# Patient Record
Sex: Female | Born: 1945 | Race: White | Hispanic: No | Marital: Single
Health system: Midwestern US, Academic
[De-identification: ages and names within clinical notes are randomized; demographics above are authoritative.]

---

## 2014-10-17 IMAGING — MG MAMMOGRAM, DIGITAL SCREEN BILA
5 series · 8 of 8 positions shown · non-contrast
Comparison: [DATE] [DATE], [DATE], [DATE] [DATE], [DATE].

MAMMOGRAM DIGITAL SCREENING BILATERAL
TECHNIQUE: Four standard projections plus supplemental left XCCL projection with CAD.

[R CC]
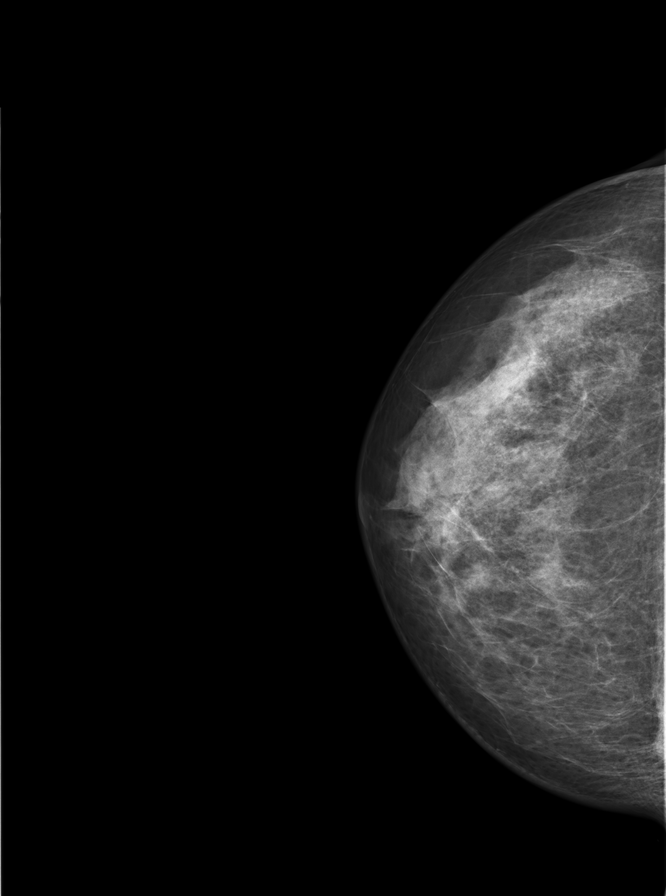

[L CC]
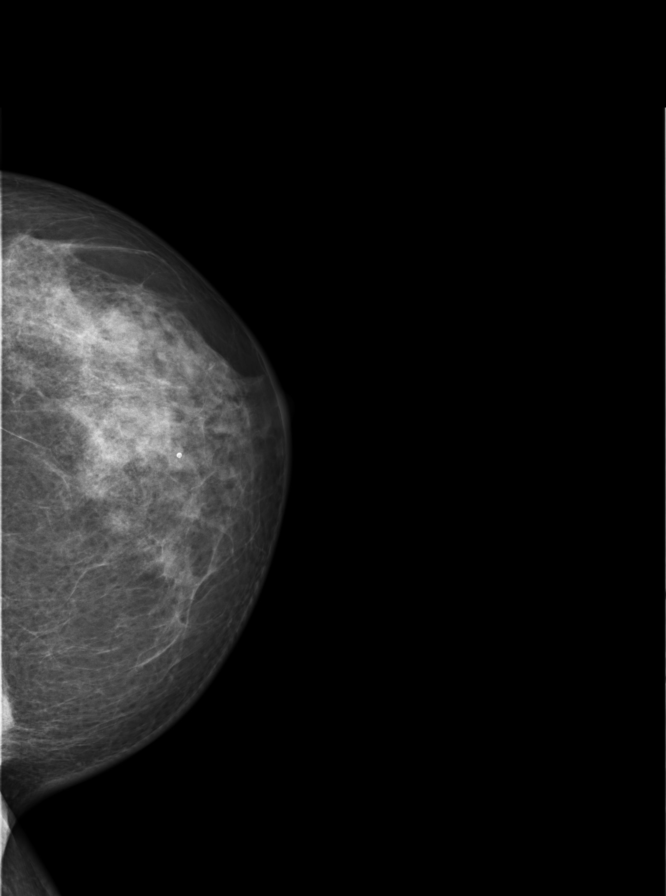

[Series 3: R MLO · right · 2 of 2 slices shown]
[im 1/2]
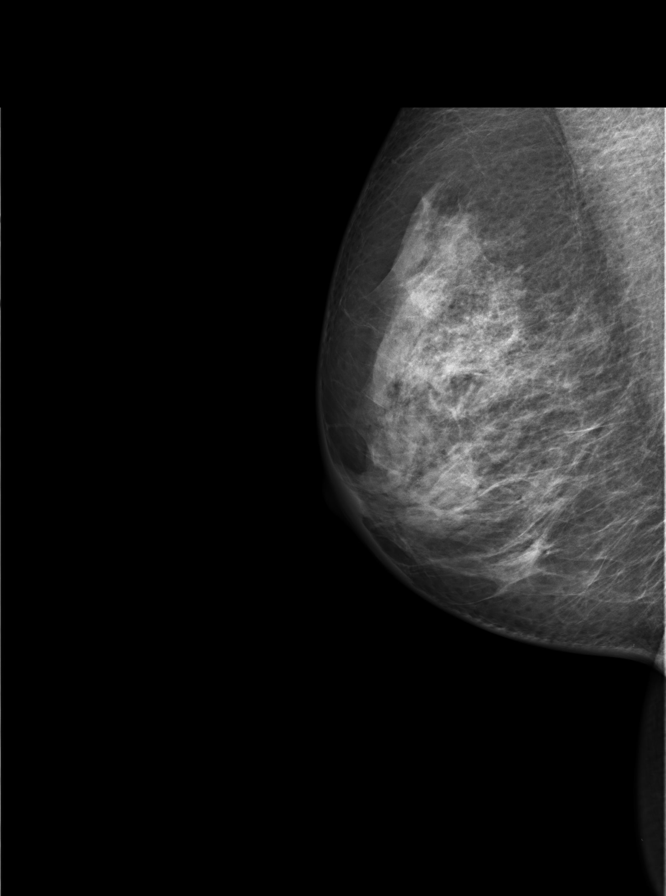
[im 2/2]
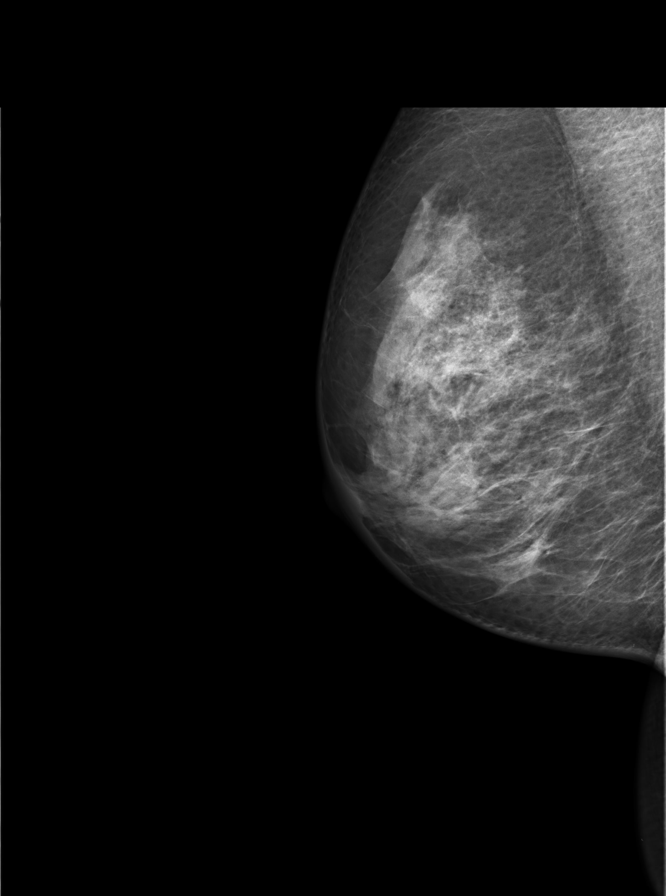

[Series 4: L MLO · left · 2 of 2 slices shown]
[im 1/2]
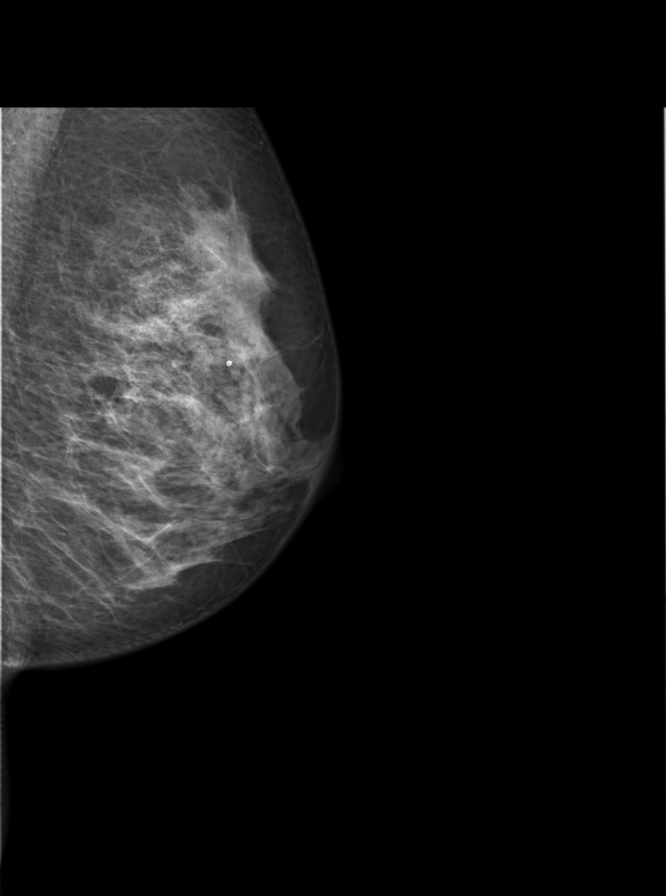
[im 2/2]
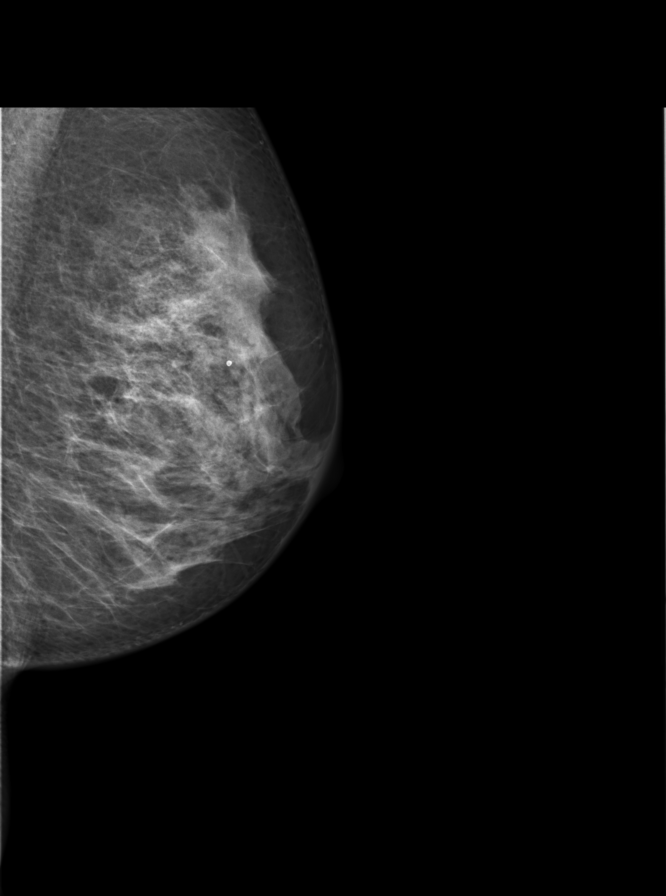

[Series 5: L XCC · left · 2 of 2 slices shown]
[im 1/2]
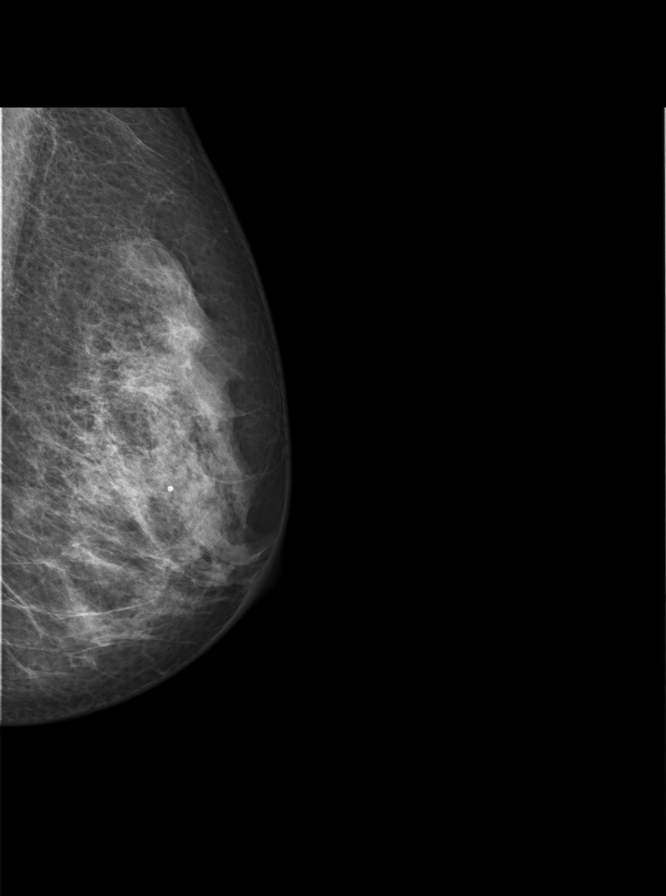
[im 2/2]
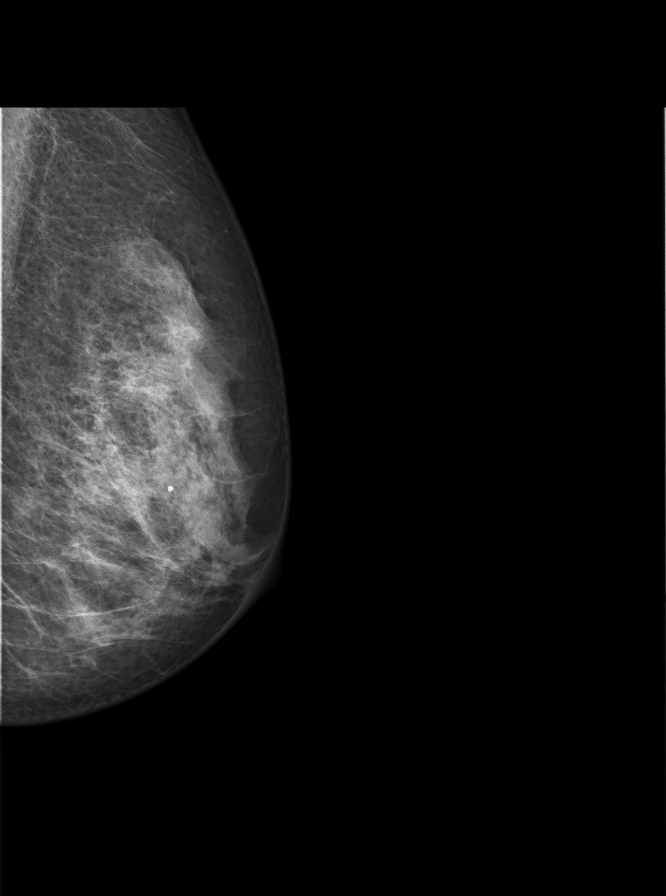

[8 of 8 positions shown; findings below may reference images not displayed]

IMPRESSION: BIRADS 1:  Negative mammogram.
Annual mammographic follow up is recommended.
The absence of a significant mammographic finding should not delay biopsy of
any clinically suspicious lesion.

INDICATIONS:
Screening.
Nulliparity.
FINDINGS: There is a stable scattered fibroglandular pattern.
There is no new, dominant, nor suspicious parenchymal asymmetry,
architectural distortion or calcification.

X-ray dose in mGy
Right CC
Right MLO
Left CCA
Left MLO
Left XCCL

BI-RADS:

FOLLOWUP:

Tech Notes: NULLIPARITY.  SCREENING.  AB
([id number])

## 2015-08-05 IMAGING — CR PELVIS
3 series · 3 of 3 positions shown · non-contrast
Comparison: Lumbar spine

EXAM: Right hip
INDICATION: Right hip pain
TECHNIQUE: AP and frog-leg projections.

[pelvis]
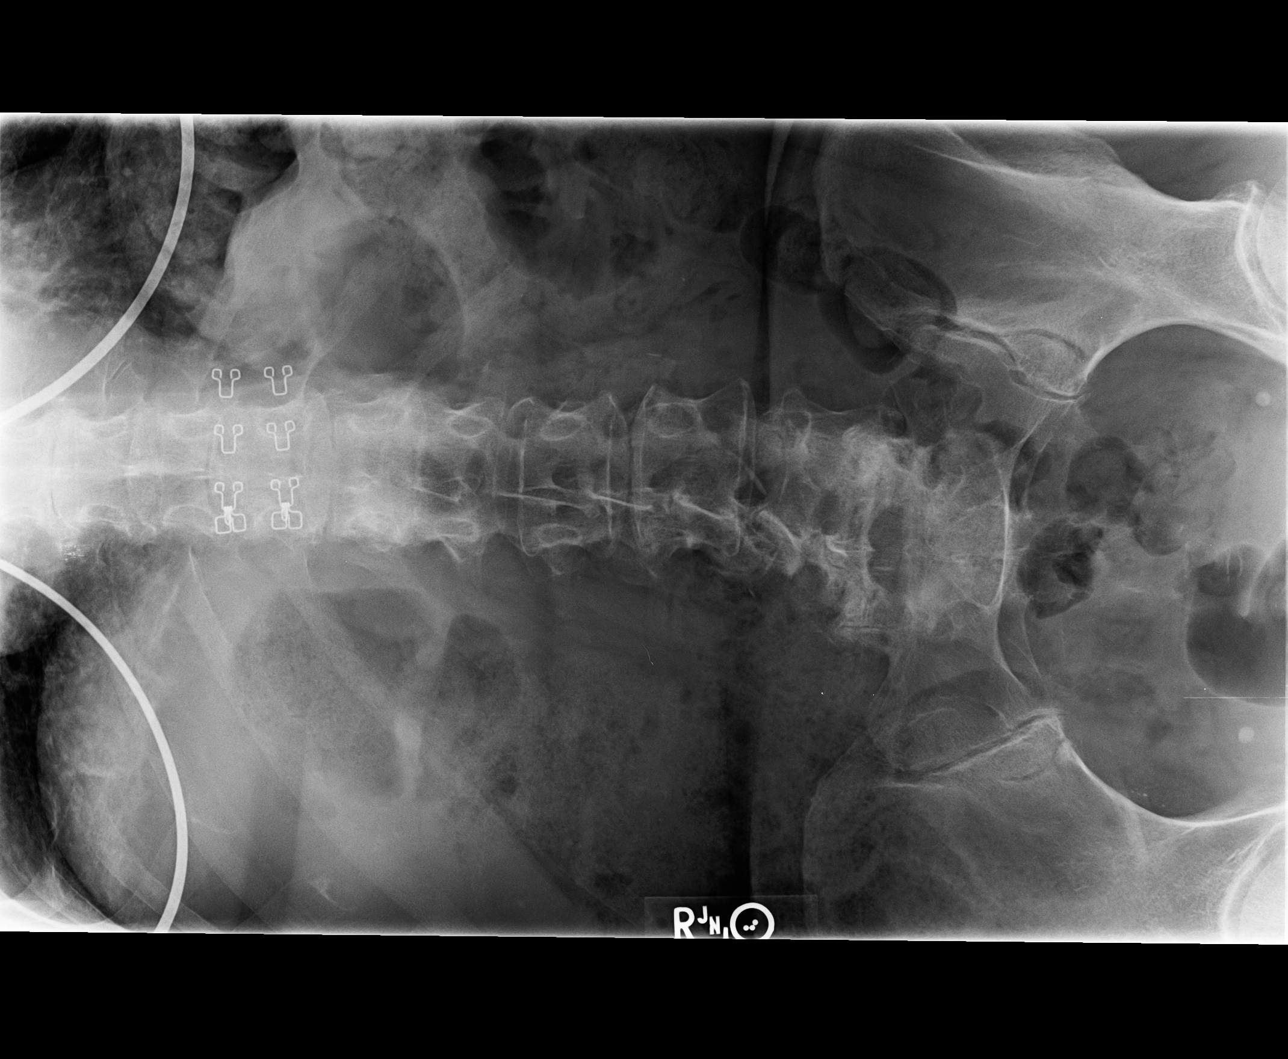

[hip ap]
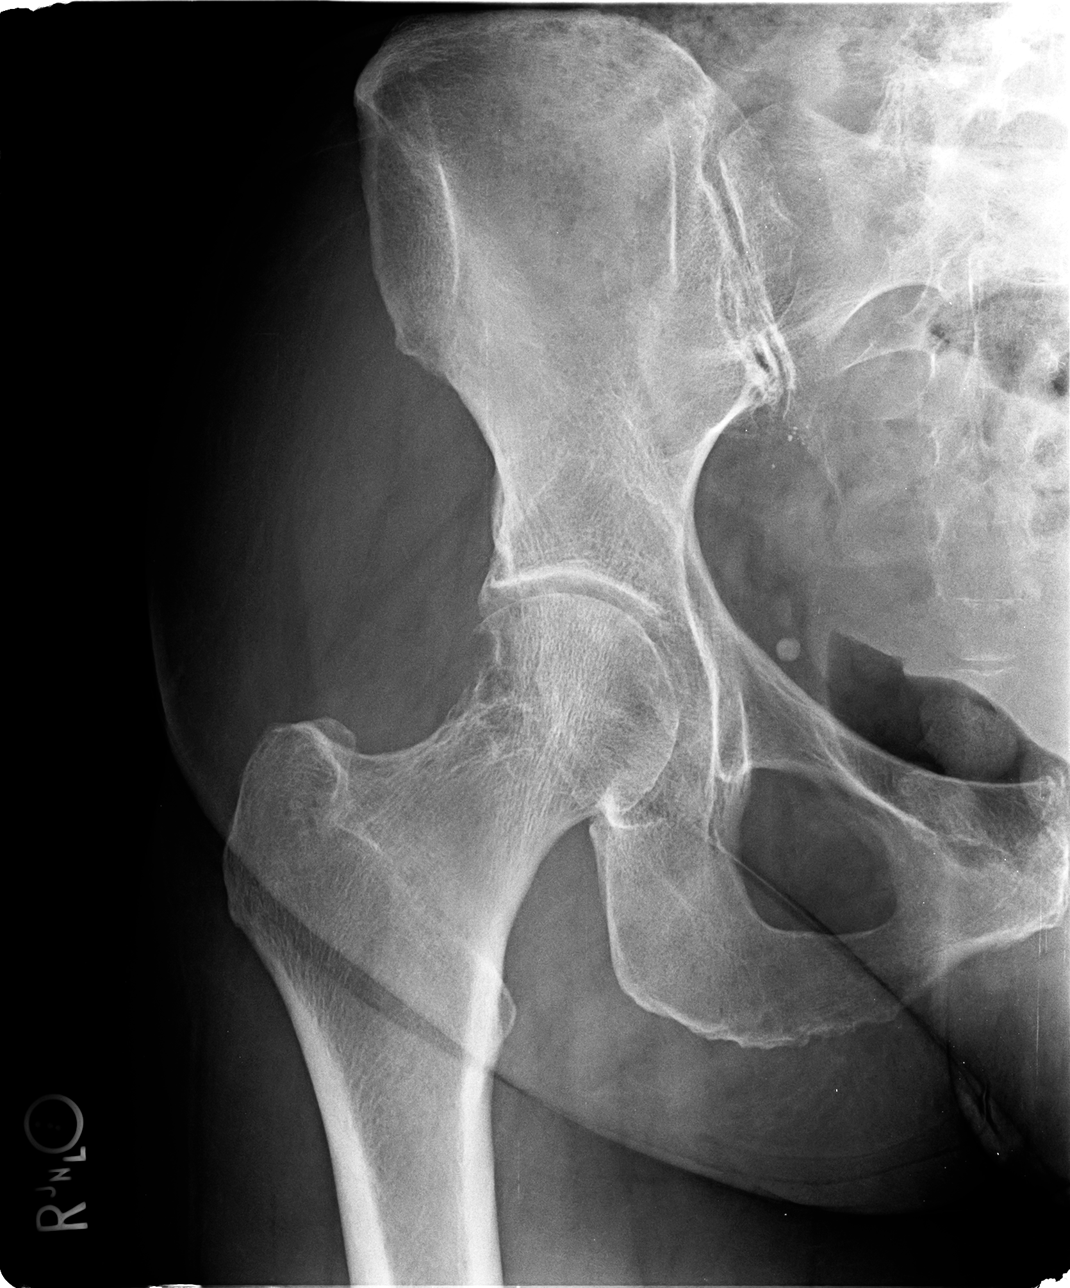

[hip frog]
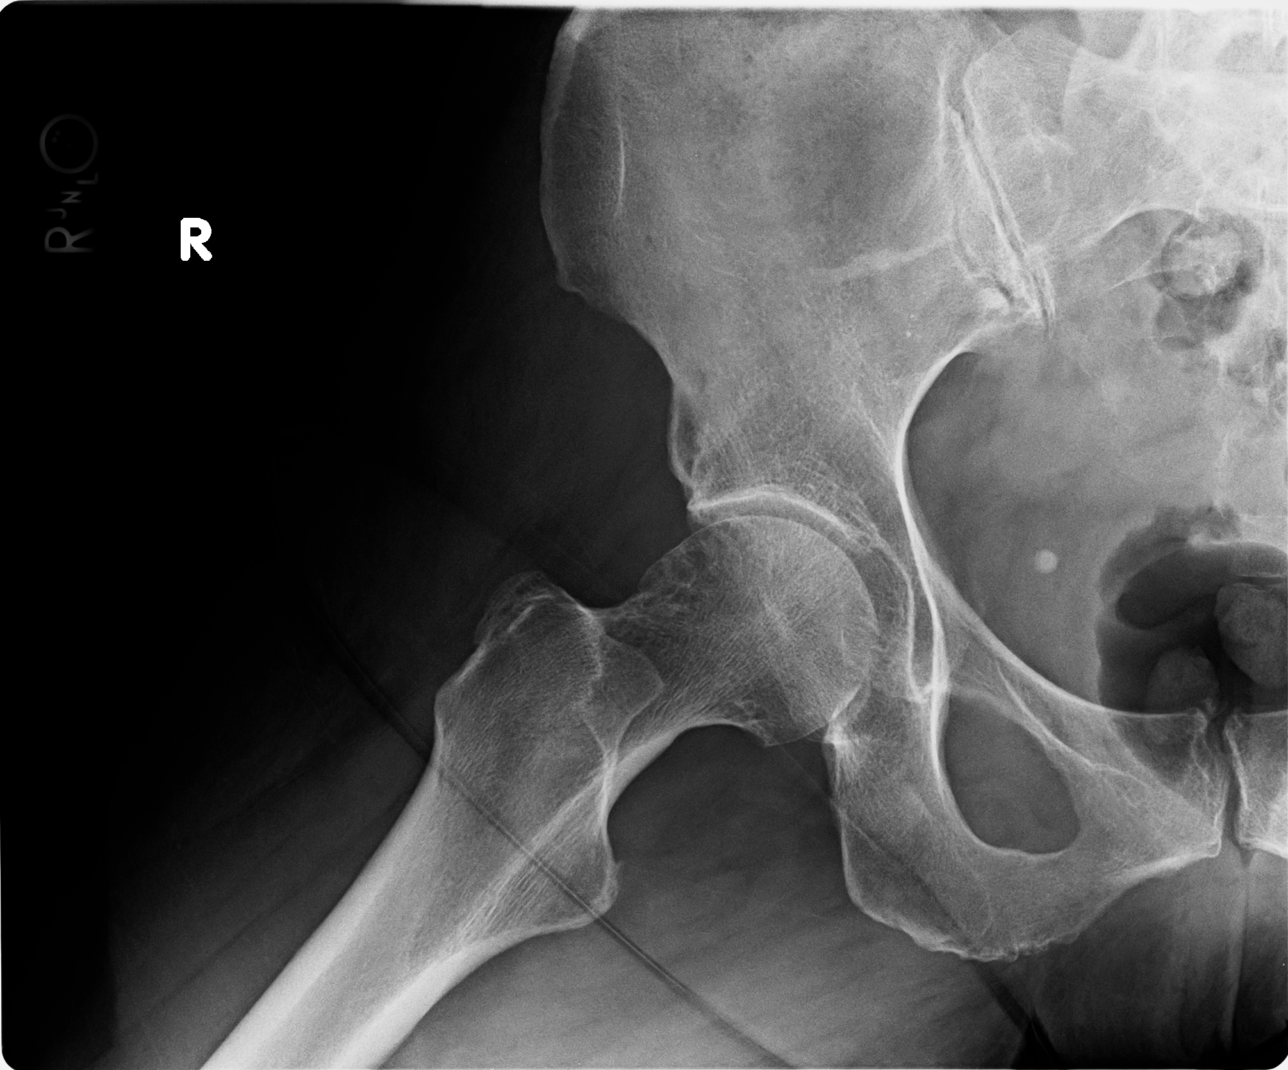

[3 of 3 positions shown; findings below may reference images not displayed]

IMPRESSION: Multiple 5-7 mm cysts of the lateral aspect of the right femoral head and neck
extending approximately 50% in thickness of the hip.
No acute bony process.
MRI of the hip should be considered
FINDINGS: There is no acute fracture or dislocation.
The joint space is preserved.
There is no significant arthrosis of the hip joint.
There are multiple 5-7 mm lucent area along the lateral aspect of the right
femoral head and neck compatible with cysts.
These extend through approximate 50% of the thickness of the hip.
There there is mild sclerosis of the muscular insertions of the ischial
tuberosity

## 2015-08-08 IMAGING — CR BMD
1 series · 1 of 1 positions shown · non-contrast
Comparison: None.

EXAM:  BONE DENSITOMETRY
INDICATION: Osteoporosis.
Takes calcium daily.
Approximate one inch in height loss.
Calcium deficiency on blood work.
Natural menopause at age 50.
Took Premarin for two years.
History of wrist fracture in 2404.
TECHNIQUE: Dual energy x-ray photometry of the lumbar spine and the left hip.

[contrast]
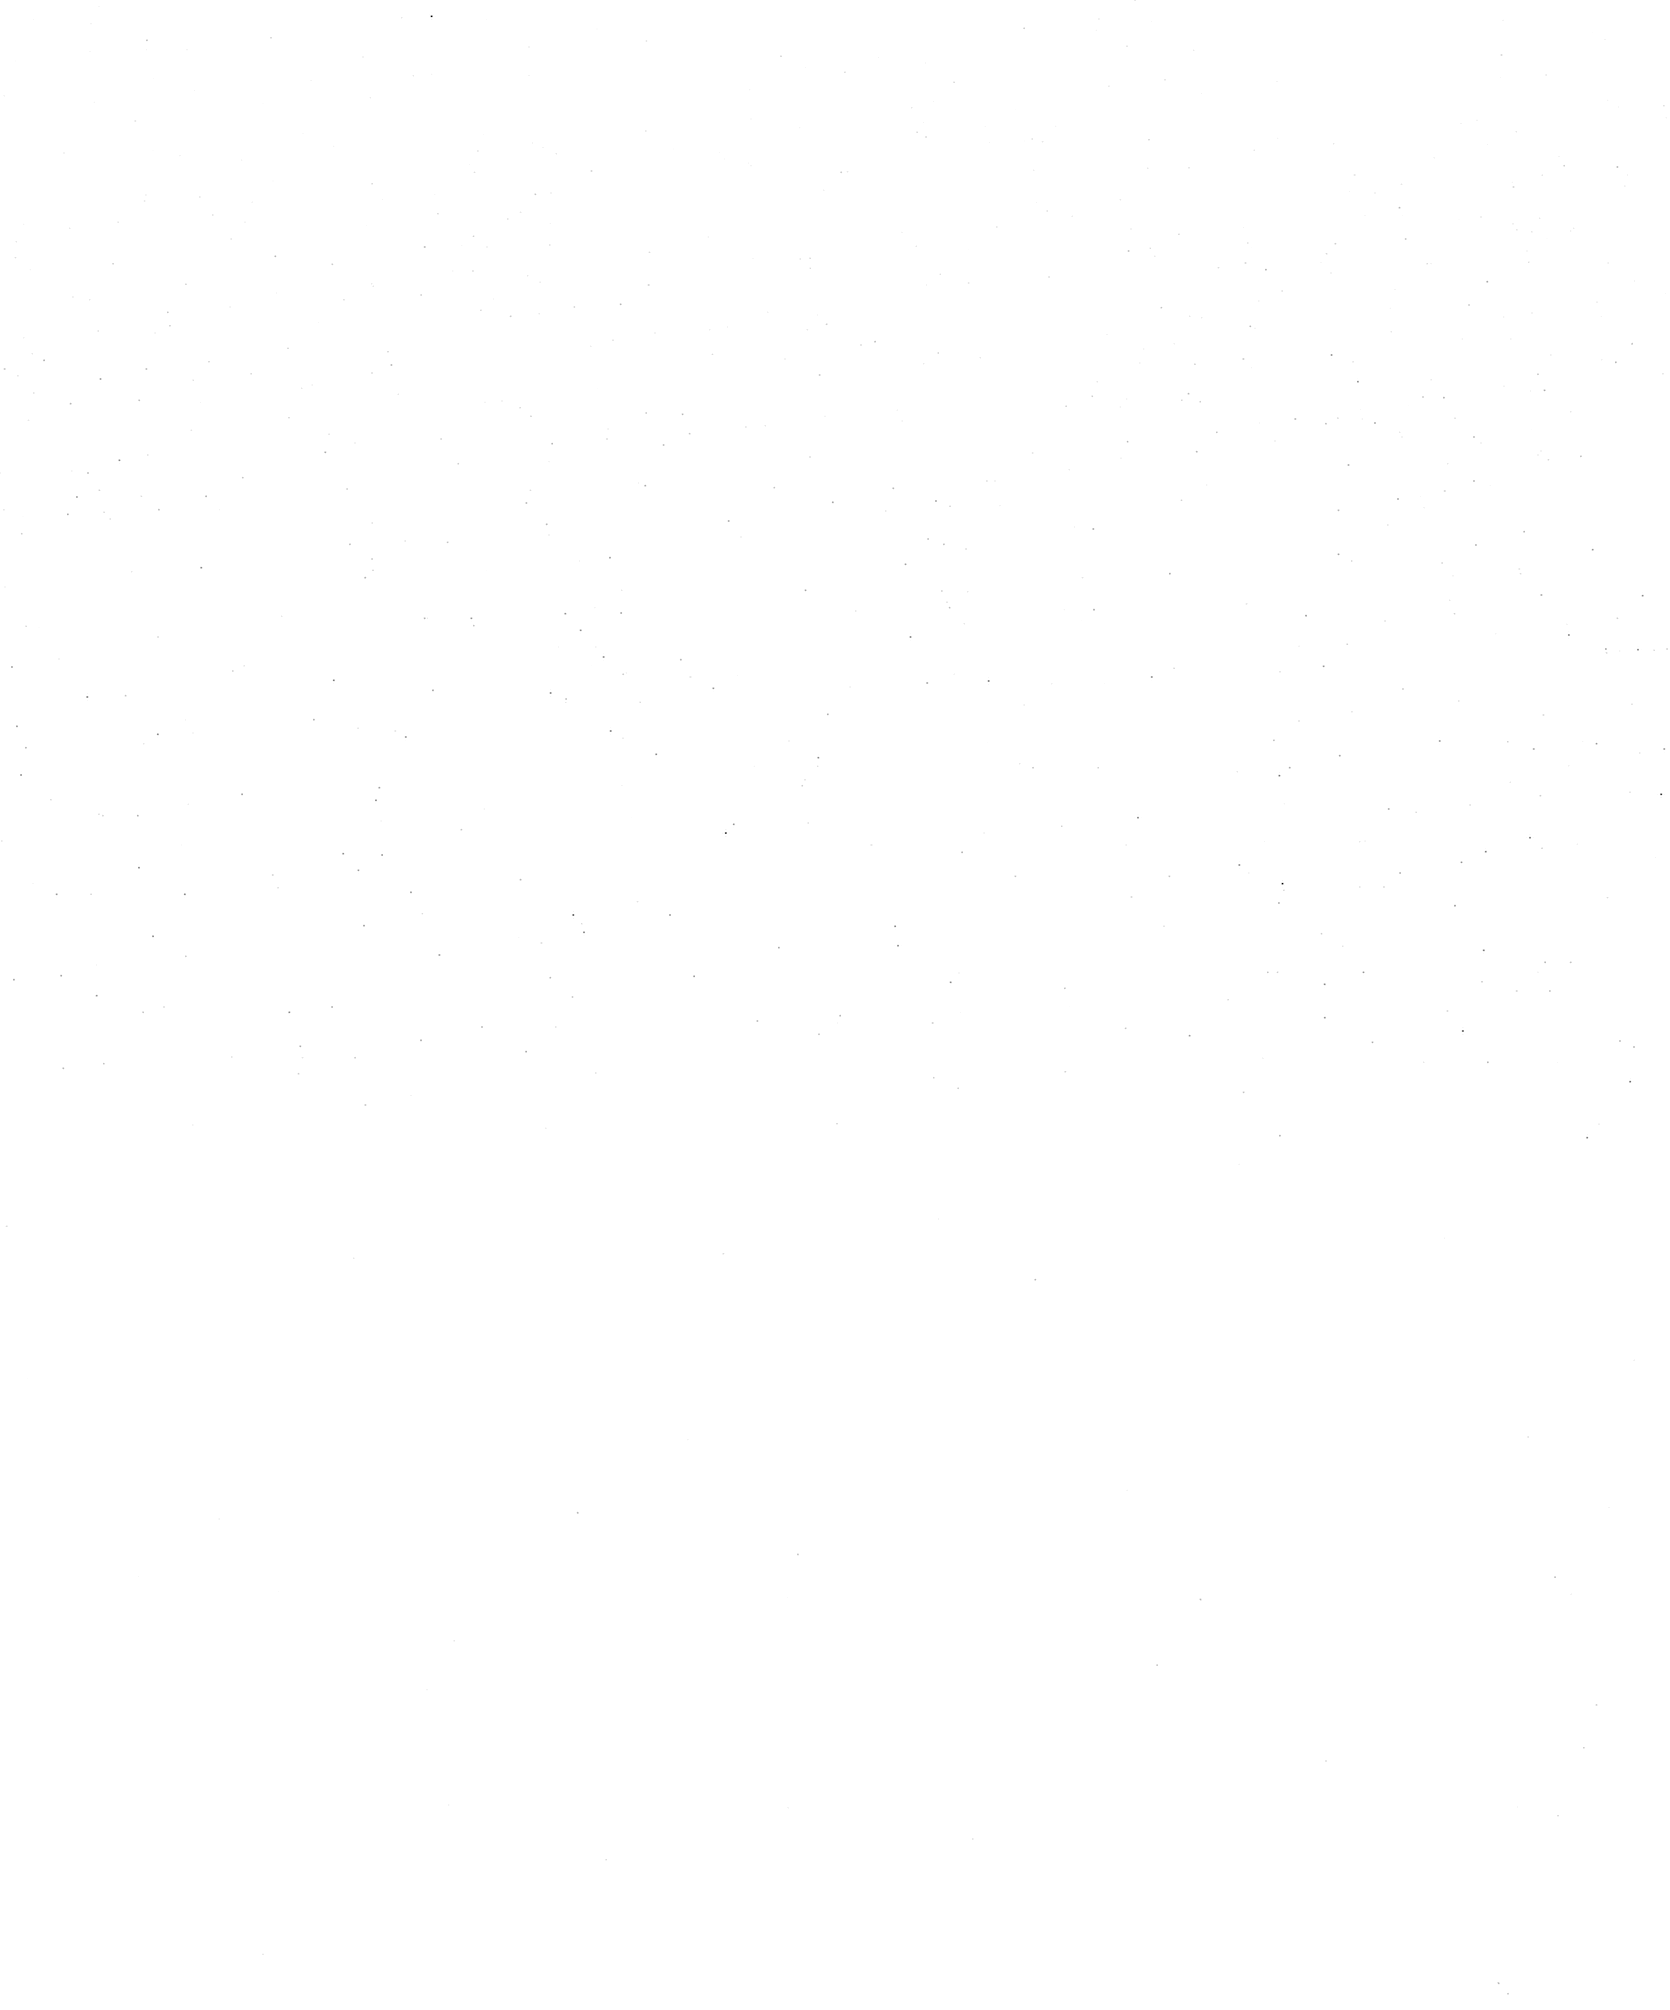

[1 of 1 positions shown; findings below may reference images not displayed]

FINDINGS: There is a mild levoscoliosis of the lumbar spine with mild-to-moderate spondylosis.
Bone mineral density 1.045 g/cm2; T-score -1.3, Z-score 0.6.
Left hip:  Bone mineral density is 0.82 g/cm2; T-score -1.5, Z-score -0.3.
IMPRESSION: Lumbar spine and left hip, osteopenia, fracture risk increased.

Tech Notes: APPROX 1 INCH HEIGHT LOSS. CALCIUM DIFFICIENCY ON BLOOD WORK. NATURAL MENOPAUSE AT AGE
50-TOOK PREMARIN FOR 2 YEARS. TAKES CALCIUM DAILY-HELD TODAY. H/O WRIST FX IN 2404. ME

## 2015-08-13 IMAGING — MR Hips^ROUTINE
4 of 6 series · 29 of 48 positions shown · non-contrast
Comparison: none

[Series 2: T1 · coronal · 4.0mm · 0.74mm/px · 7 of 19 slices shown]
[im 1/19]
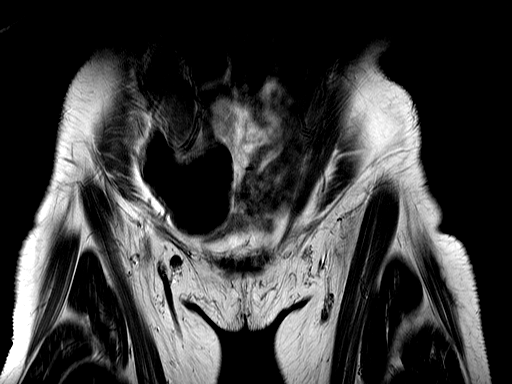
[im 4/19]
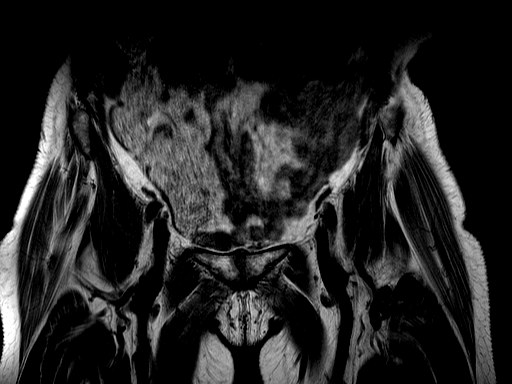
[im 7/19]
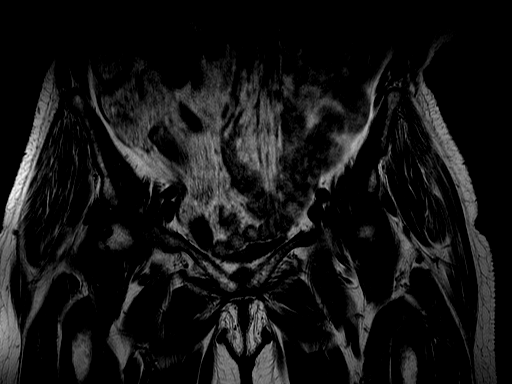
[im 10/19]
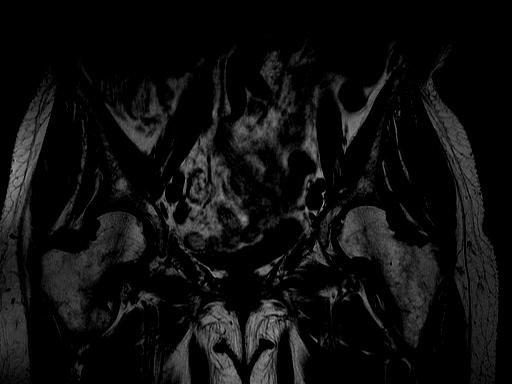
[im 13/19]
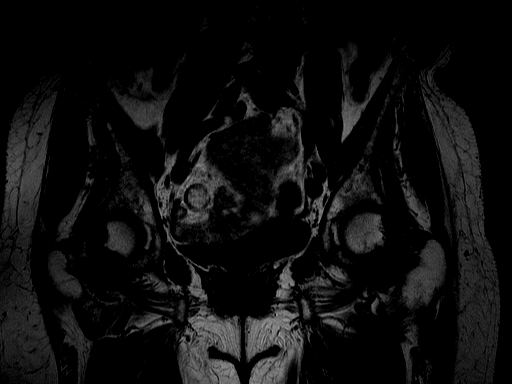
[im 16/19]
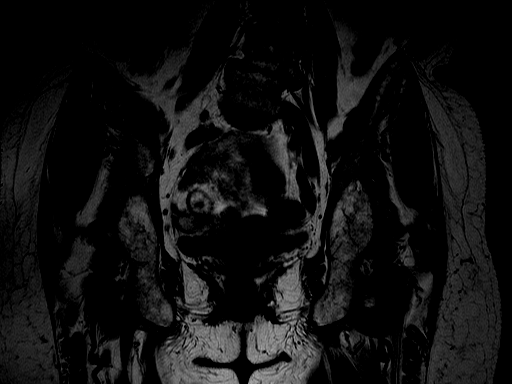
[im 19/19]
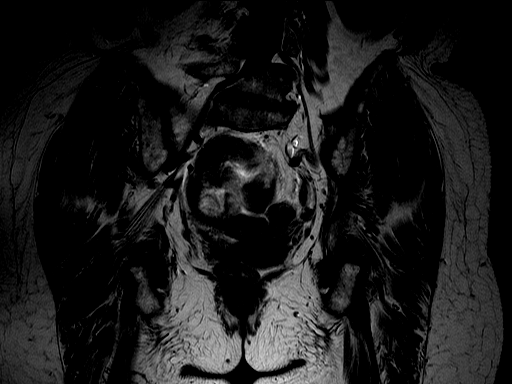

[Series 4: T2 fat-sat · axial · 5.0mm · 0.68mm/px · z∈[-89,+79]mm · 9 of 28 slices shown (1 of 2)]
[im 1/28]
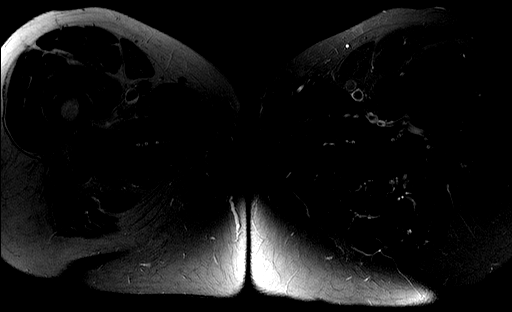
[im 4/28]
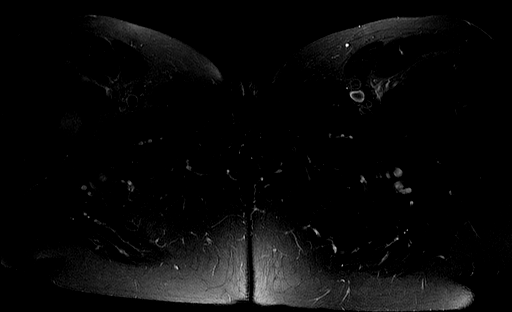
[im 7/28]
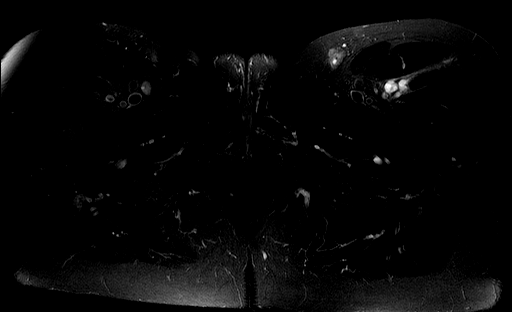
[im 11/28]
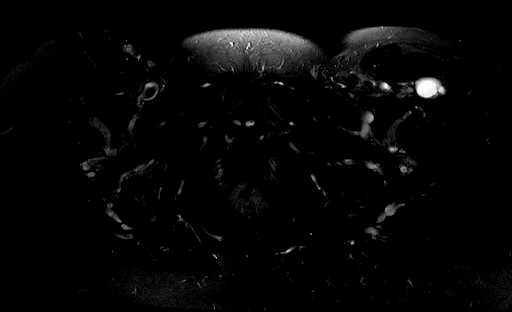
[im 14/28]
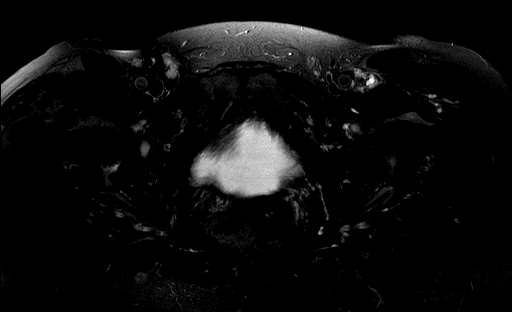
[im 17/28]
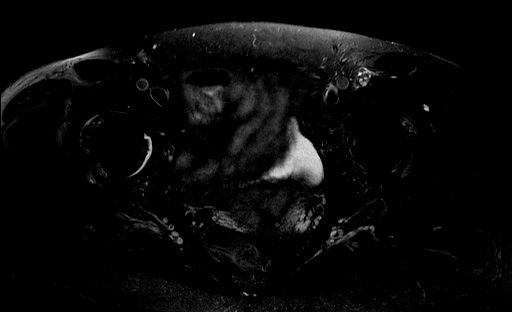
[im 21/28]
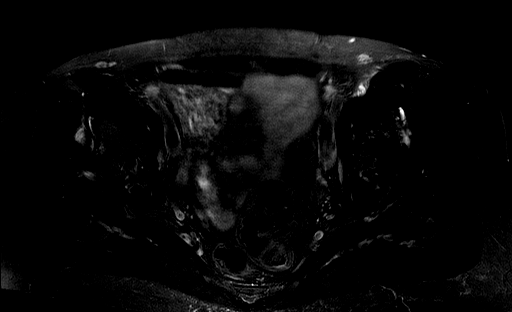
[im 24/28]
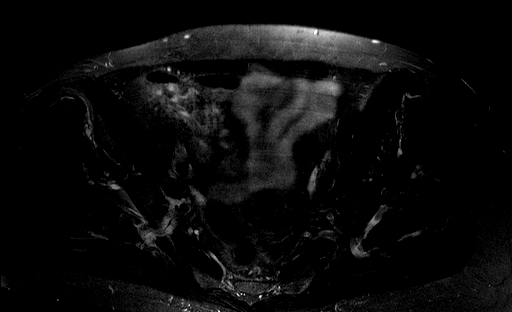
[im 28/28]
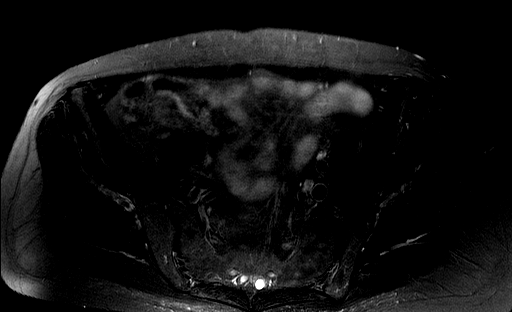

[Series 5: T1 fat-sat · axial · 5.0mm · 0.74mm/px · z∈[-90,+53]mm · 6 of 28 slices shown]
[im 1/28]
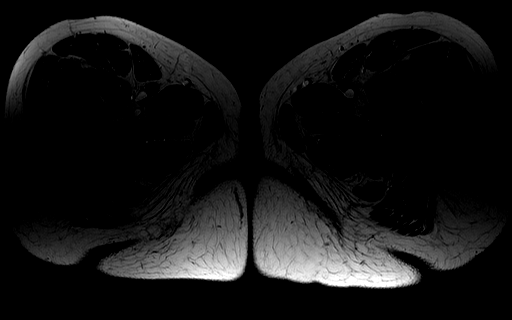
[im 4/28]
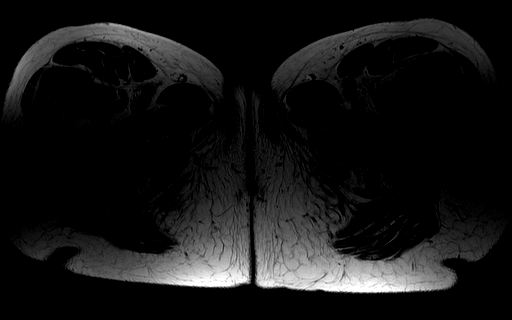
[im 7/28]
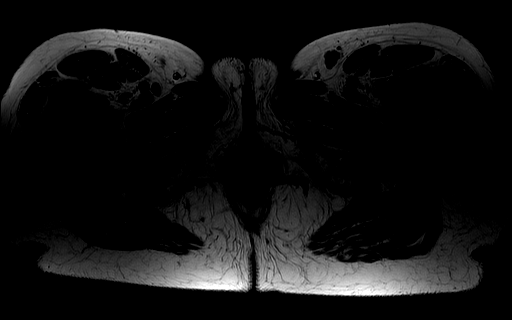
[im 11/28]
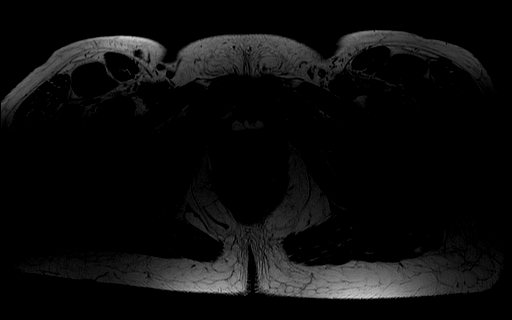
[im 14/28]
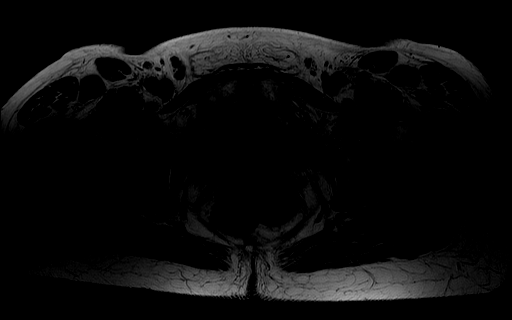
[im 24/28]
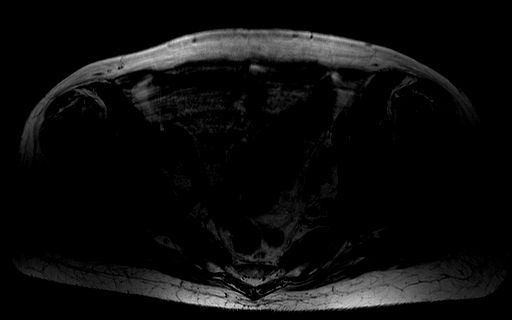

[Series 7: T2 fat-sat · sagittal · 3.5mm · 0.57mm/px · 7 of 20 slices shown (2 of 2)]
[im 1/20]
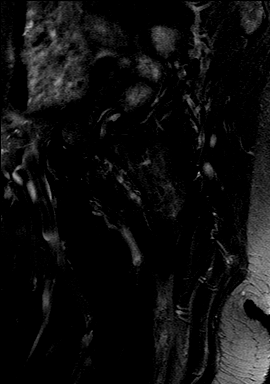
[im 4/20]
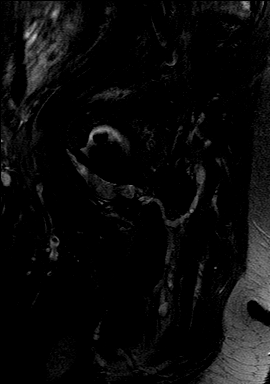
[im 7/20]
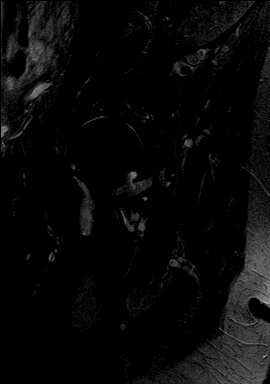
[im 10/20]
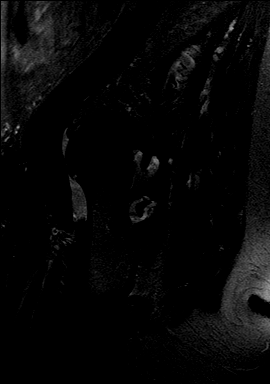
[im 13/20]
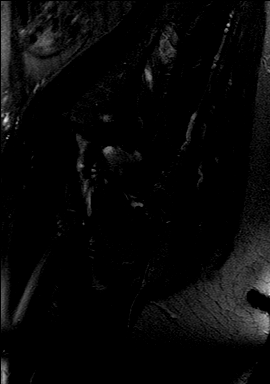
[im 16/20]
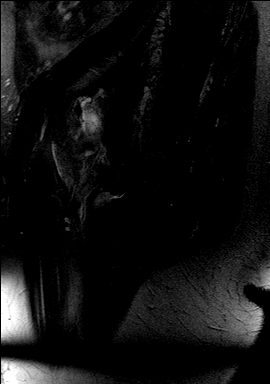
[im 20/20]
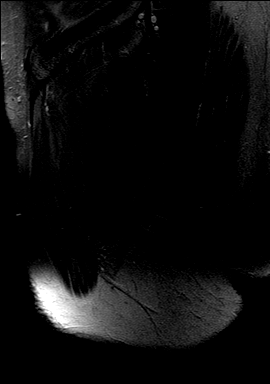

[29 of 48 positions shown; findings below may reference images not displayed]

MRI REPORT

DIAGNOSTIC STUDIES

EXAM
Right hip MRI.

INDICATION
right hip pain, cysts seen on xray
Right posterior hip/buttocks pain x1 year, started after putting luggage
into a car. Abnormal play film xrays.
BG

TECHNIQUE
Noncontrast brain MRI protocol. All pelvis T1, STIR, T2 fat sat sequencing with sagittal T2 fat
saturated sequence of the right hip.

COMPARISON
No prior studies are available for comparison.

FINDINGS
Marrow/joint space: There is mild bilateral hip degeneration with marginal osteophytic spurring
and mild subchondral cystic change, particularly at the right lateral acetabulum. There is also
cystic change at the marginal femoral head neck junction bilaterally. There is a right hip joint
effusion. Loss of femoral head neck junction is suggested bilaterally with synovial herniation pits
appreciated, particularly on the right. There is also small amount of left hip joint fluid. Prior
hip radiographs are not available for review at this time. There is no evidence of periostitis or
aggressive osseous lesion. No fracture. Lumbar spondylosis is moderate to severe with grade 1 left
lateral listhesis of L4 on L5.
Soft tissues: There is mild edema around both hips, right greater than left. This extends into the
right gluteus minimus muscle. There is mild edema around the greater trochanter bilaterally.
Partial thickness tearing of the right gluteus minimus insertion, greater than 50 percent
thickness. There is a small cluster of cysts in the left hip anterior to the vastus medialis and
posterior to the rectus femoris muscle which measures up to approximately the 4.2 x 1.3 x 8 the
centimeters in dimension. Small lymph nodes are appreciated in the inguinal region bilaterally. The
urinary bladder is largely decompressed. There is no disruption of the hamstring tendons. The hip
flexors show no disruption, though there is fluid around the left iliopsoas bursal space suggesting
bursitis. The short adductor of the hip showed no edema or atrophy.

IMPRESSION
- Bilateral hip degeneration with bilateral hip joint effusions, right greater than left.
Degenerate change of the right hip is also asymmetrically greater. There are some the erosive
changes appreciated at the posterolateral femoral head bilaterally with osteophytic spurring and
subchondral cystic changes also noted. However, there is no significant bone edema. No fracture.
Findings are nonspecific and are likely a combination osteoarthropathy with possible inflammatory
arthritis also noted. Crystalline arthropathy is not excluded. There is also a multiloculated cyst
like lesion in the left anterior hip musculature. Left iliopsoas bursitis is noted.

## 2015-10-24 IMAGING — US US DUPLEX RENAL ARTERY
1 series · 13 of 25 positions shown · non-contrast
Comparison: none

[Series 1: us duplex renal artery · 13 of 51 slices shown]
[im 1/51]
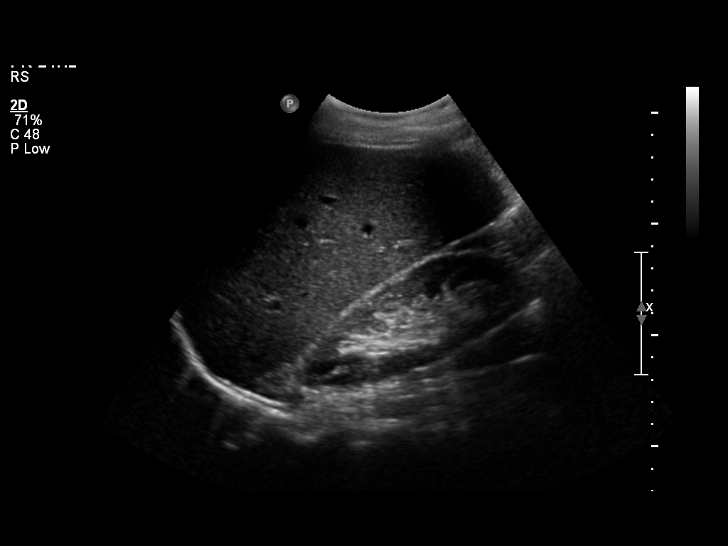
[im 5/51]
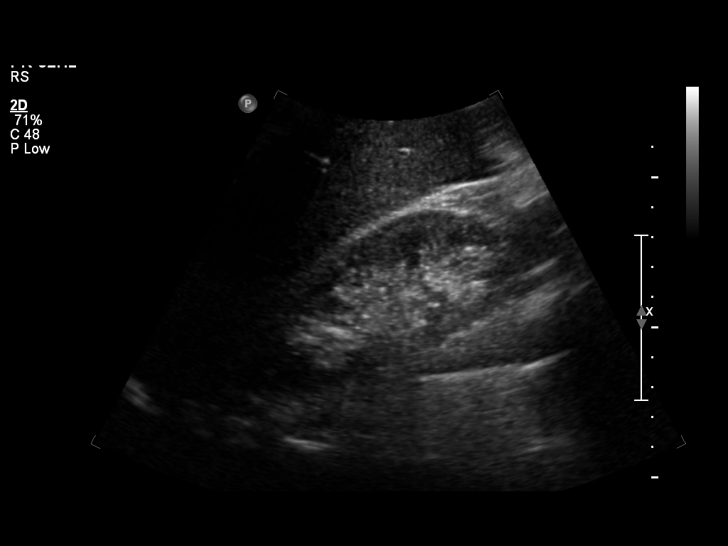
[im 9/51]
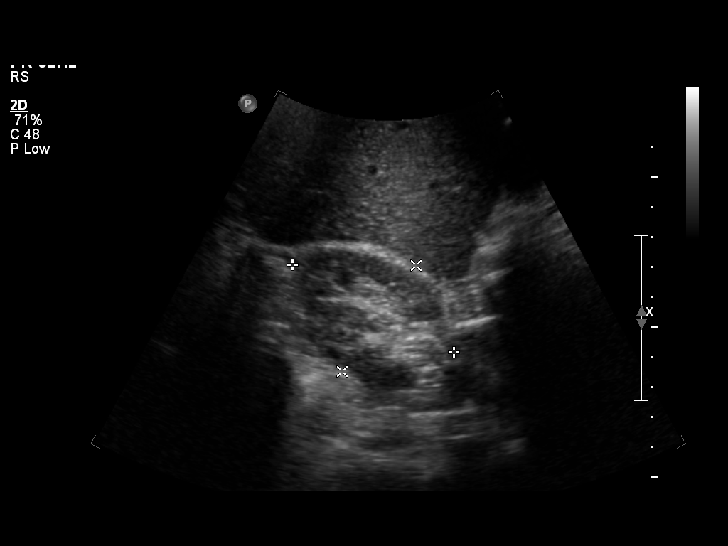
[im 13/51]
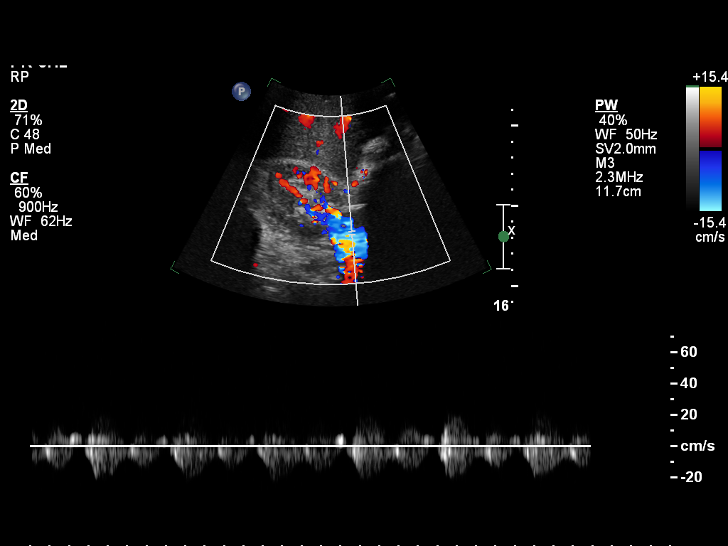
[im 17/51]
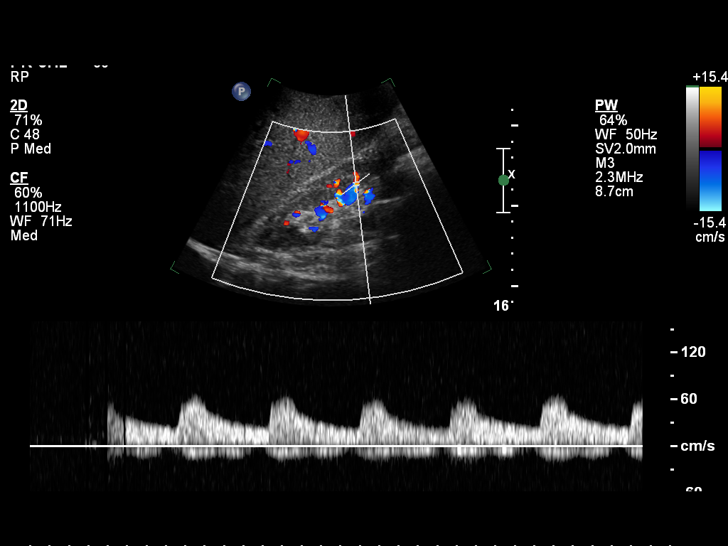
[im 21/51]
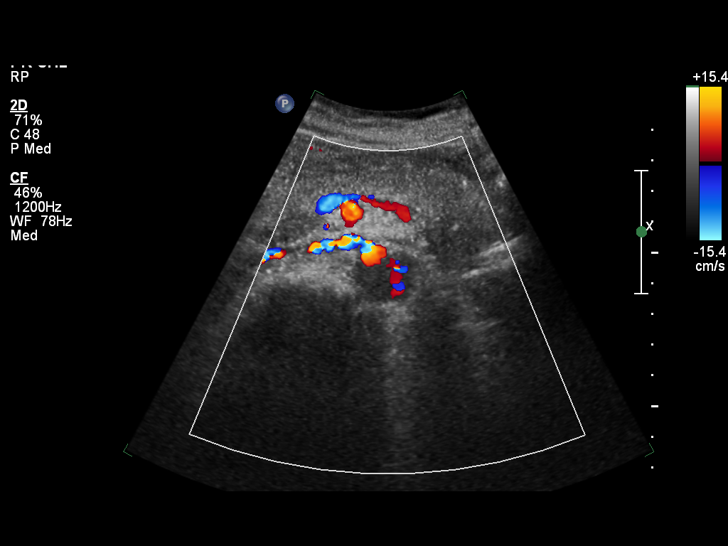
[im 26/51]
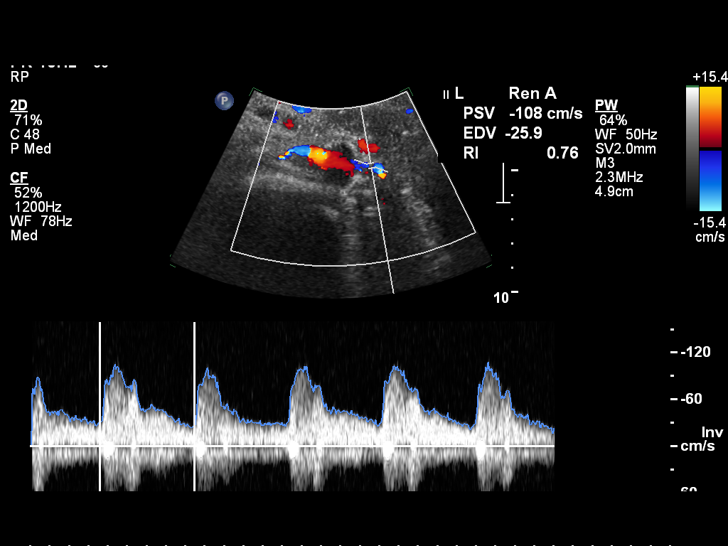
[im 30/51]
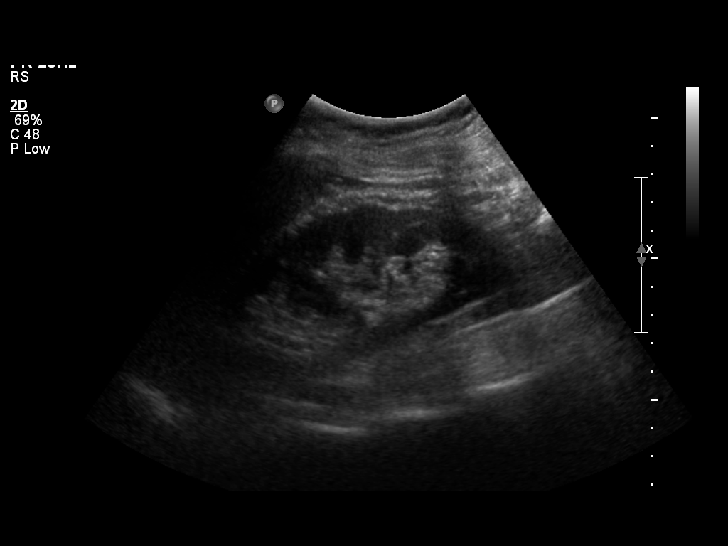
[im 34/51]
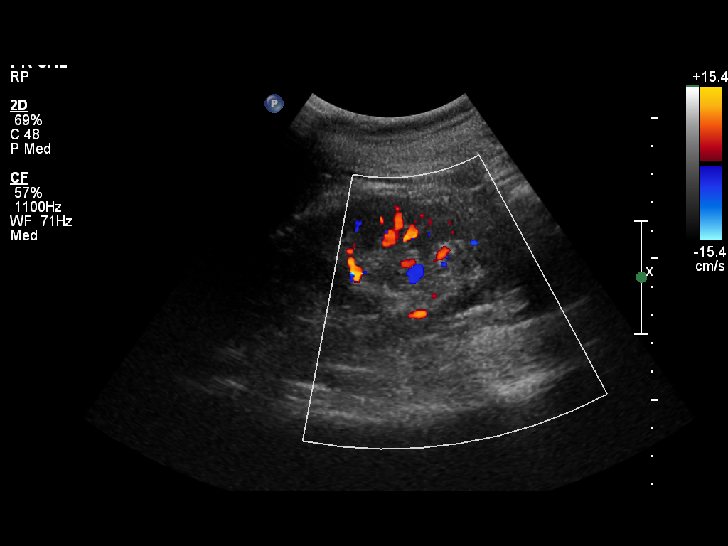
[im 38/51]
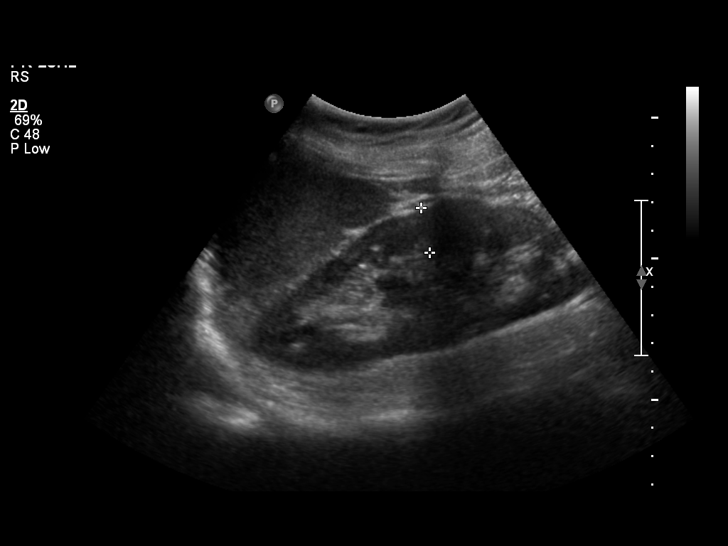
[im 42/51]
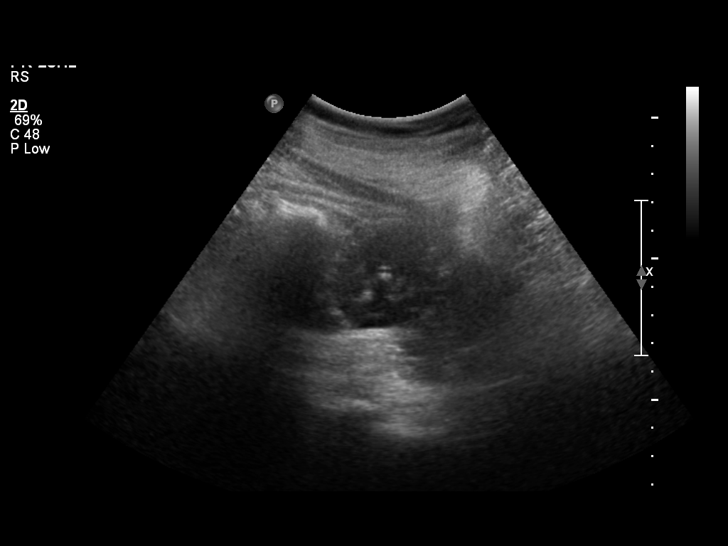
[im 46/51]
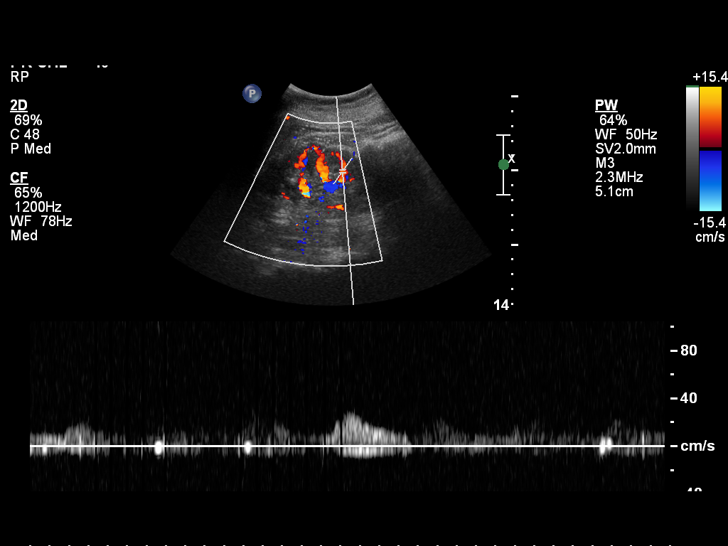
[im 51/51]
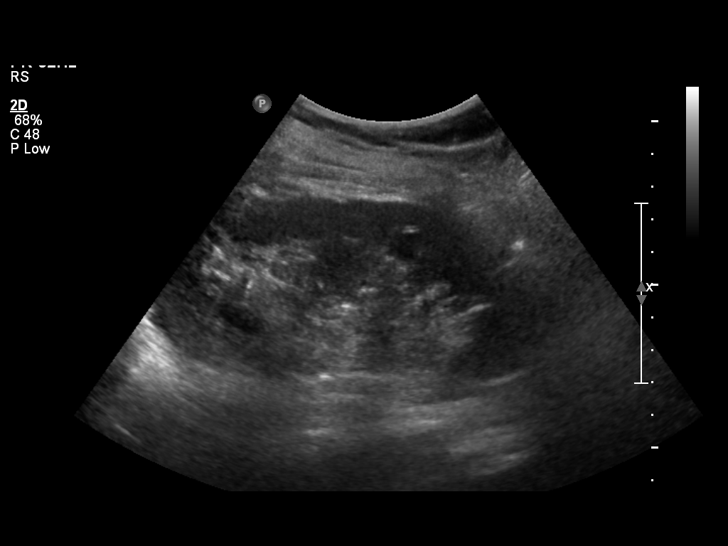

[13 of 25 positions shown; findings below may reference images not displayed]

ULTRASOUND REPORT

DIAGNOSTIC STUDIES

EXAM
Renal artery color duplex Doppler ultrasound

INDICATION
CHEST BURNING, HTN
JERRID

TECHNIQUE
Grayscale and color duplex Doppler imaging of the bilateral kidneys and accompanying vasculature
was performed with spectral analysis.

COMPARISON
None

FINDINGS
Right kidney measures 11.7 x 6.1 x 4.3 centimeter and left kidney 12.7 x 5.1 x 5.4 centimeter.
Normal thickness renal cortices. No hydronephrosis or shadowing nephrolithiasis. Normal parenchymal
echogenicity.
Right-sided renal aortic ratios and peak systolic velocities include origin RAR 1.44 and 160
centimeter/second, mid RAR 1.29 and 144 centimeter/second, and distal RAR 1.25 and 139 centimeter/
second. The right renal vein is patent. There are no parvus tardus waveforms.
Peak systolic velocity of the abdominal aorta is 111 centimeter/second.
] Left-sided renal aortic ratios and peak systolic velocities include origin RAR 1.28 and 142
centimeter/second, mid RAR 1.20 and 134 centimeter/second, and distal RAR 1.17 and 130 centimeter/
second. The left renal vein is patent. There are no parvus tardus waveforms.

IMPRESSION
No hemodynamically significant stenosis.

## 2016-05-27 IMAGING — CR NECK
6 series · 6 of 6 positions shown · non-contrast
Comparison: none

[c-spine lat]
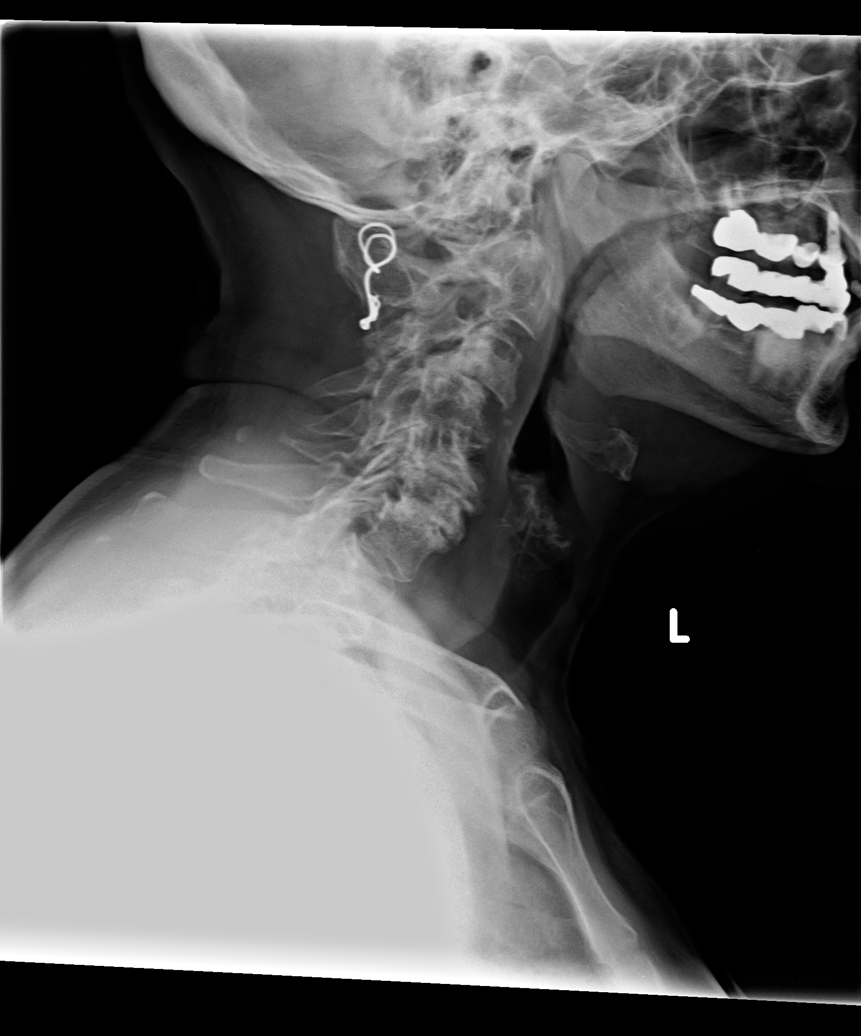

[c-spine obl (1 of 2)]
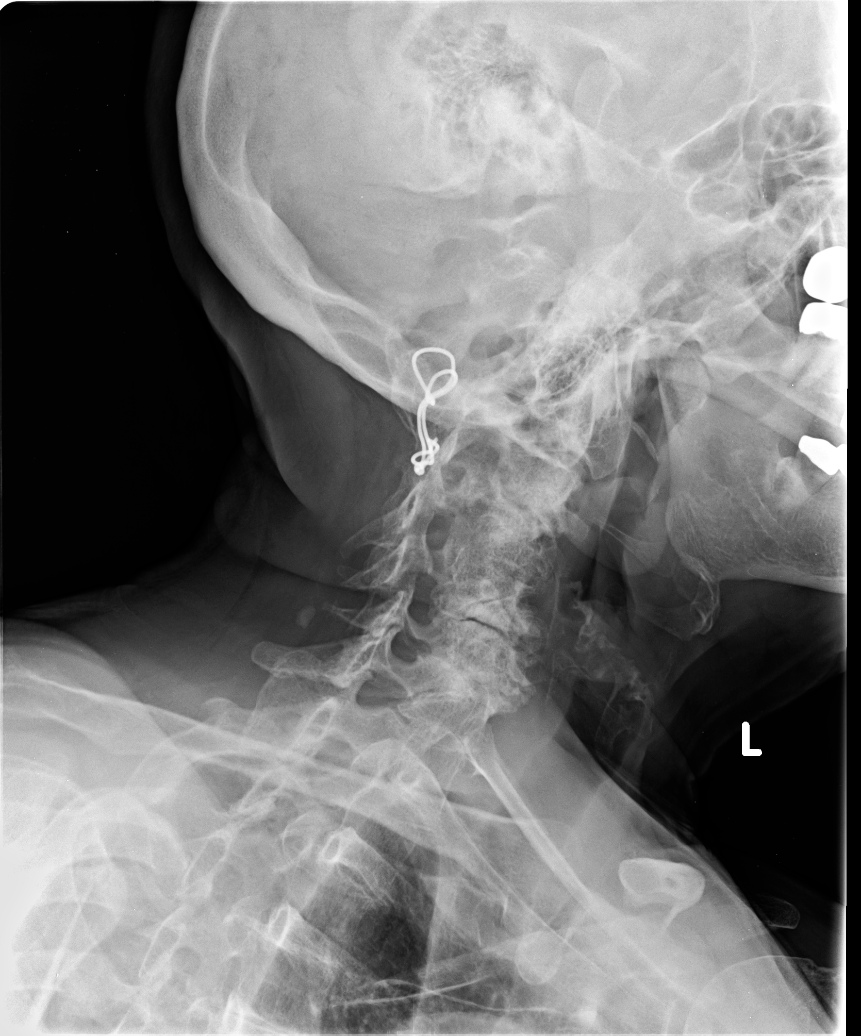

[c-spine obl (2 of 2)]
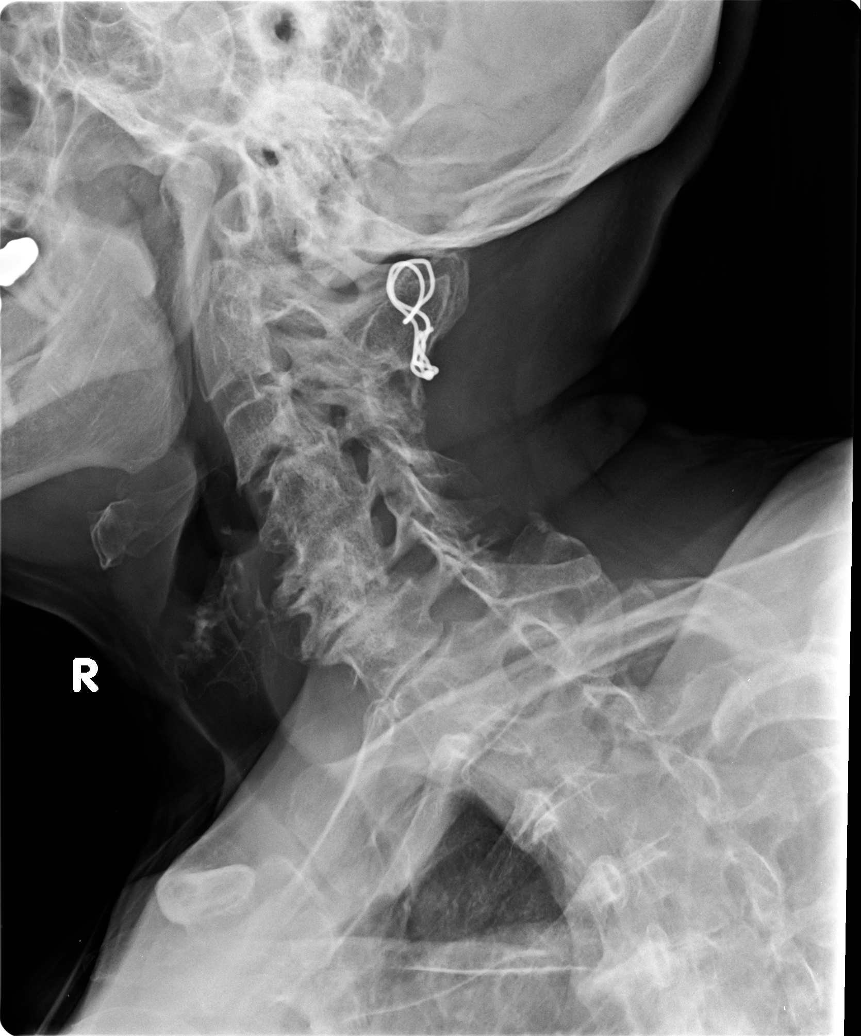

[c-spine ap]
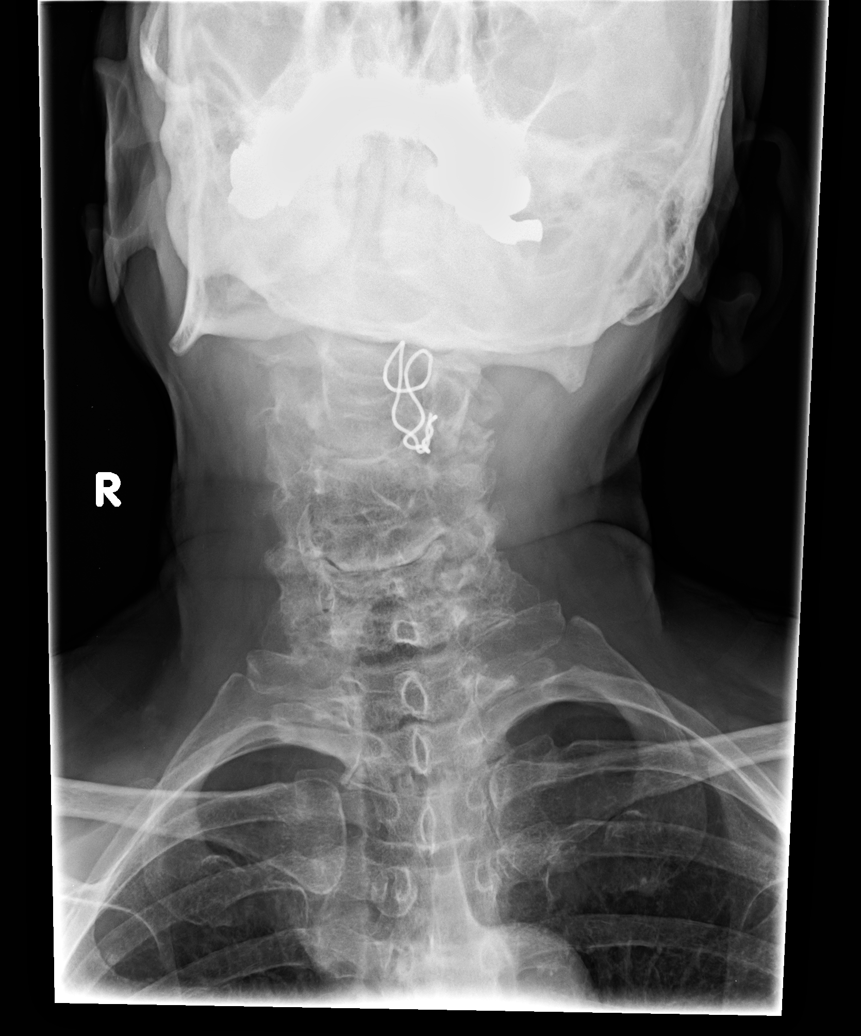

[odontoid]
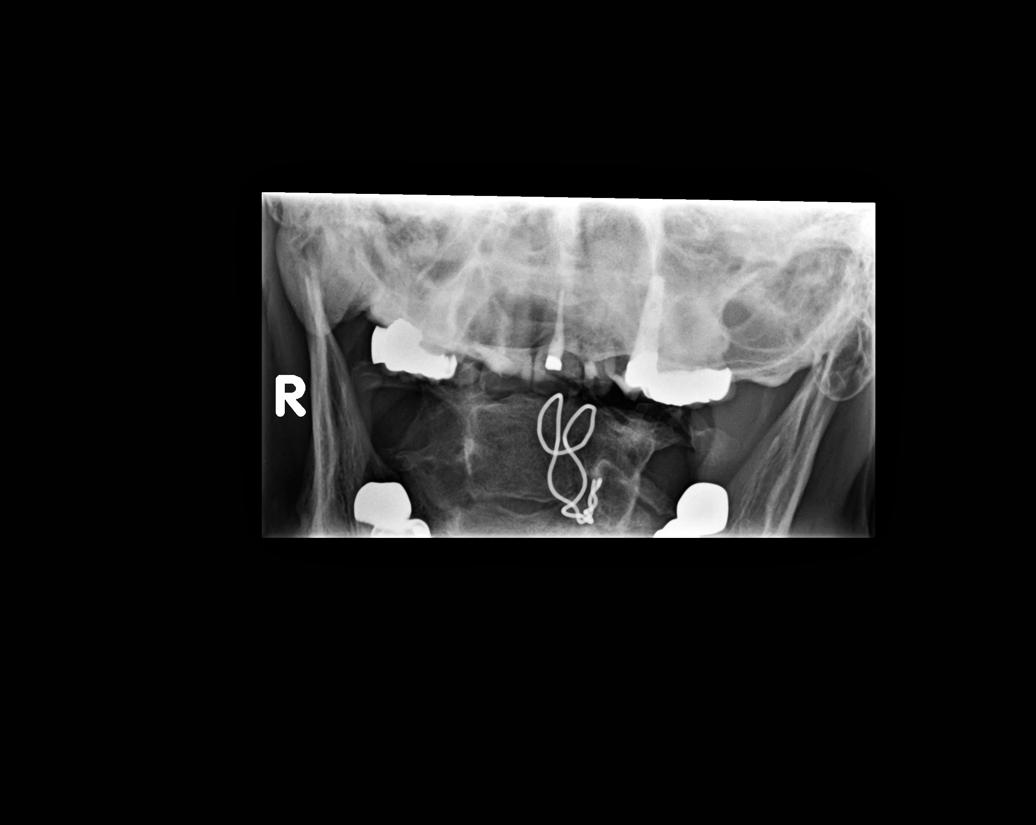

[swimmers]
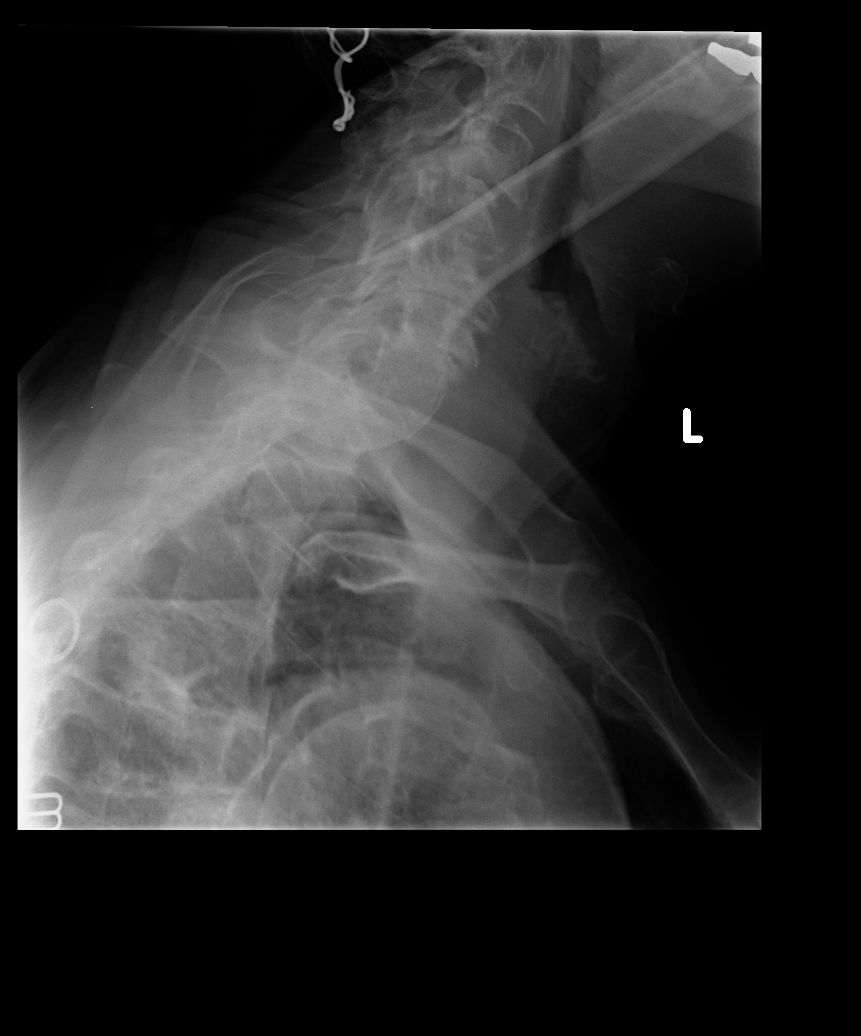

[6 of 6 positions shown; findings below may reference images not displayed]

EXAM
RADIOLOGICAL EXAMINATION, SPINE, CERVICAL 4 OR MORE VIEWS, CPT 83202

INDICATION
bil finger numbness
PT STATES HAS BILATERAL FINGER NUMBNESS. HX OF NECK SURGERY. TJ/[REDACTED]

TECHNIQUE
Views of the cervical spine were acquired.

COMPARISONS
None

FINDINGS
There are 7 cervical vertebral bodies identified. There is diffuse cervical spondylosis. There is
intervertebral disc space narrowing at every level. Posterior element fusion with cerclage wires
and the C2-3 level is noted. There is grade 1-2 anterolisthesis of C3 on C4.
Severe anterior and posterior disc osteophyte complex formation is seen from C3 through C7. There
is bilateral diffuse facet arthrosis and oblique views demonstrate moderately severe left-sided
neural foraminal narrowing at the C3-4 and C4-5 levels. On the contralateral right side there is a
moderate neural foraminal narrowing at the C3-4 level.
Open mouth view demonstrates a normal relationship of C1 and C2.

IMPRESSION
Diffuse cervical spondylosis as described above.

## 2016-06-01 IMAGING — MR C-spine^Routine
5 series · 42 of 48 positions shown · non-contrast
Comparison: none

[Series 2: T2 · sagittal · 3.0mm · 0.57mm/px · 6 of 11 slices shown]
[im 1/11]
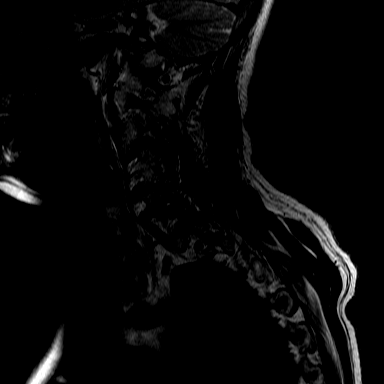
[im 3/11]
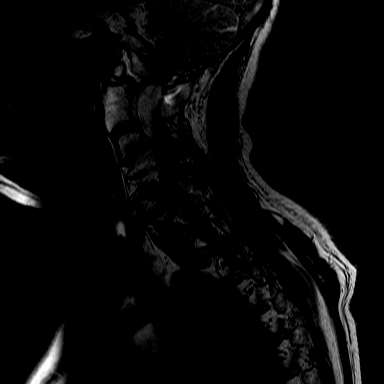
[im 5/11]
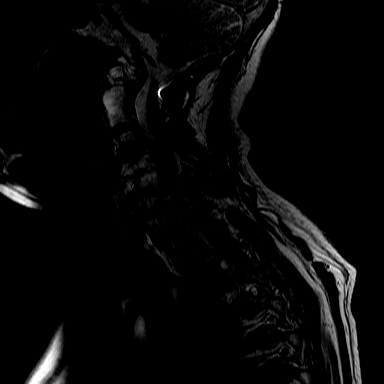
[im 7/11]
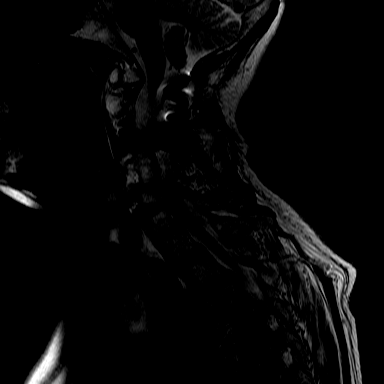
[im 9/11]
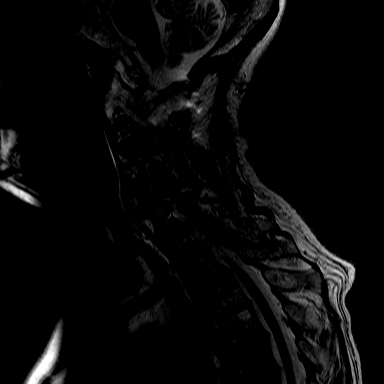
[im 11/11]
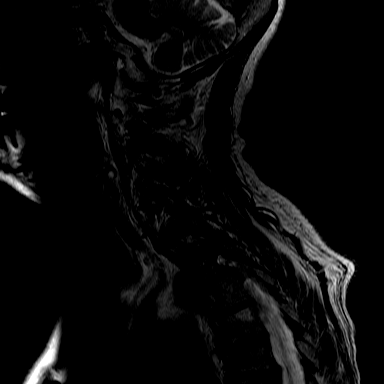

[Series 3: T1 · sagittal · 3.0mm · 0.69mm/px · 7 of 11 slices shown (1 of 2)]
[im 1/11]
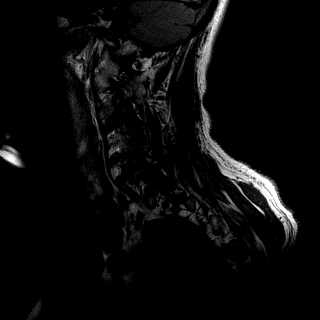
[im 2/11]
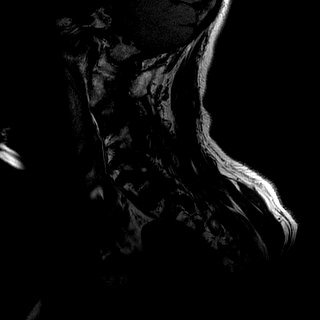
[im 4/11]
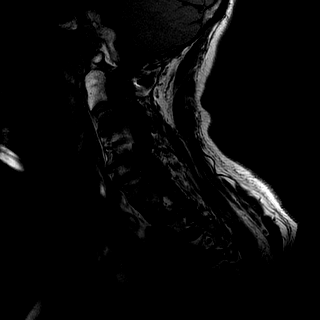
[im 6/11]
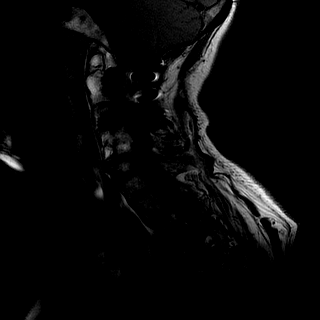
[im 7/11]
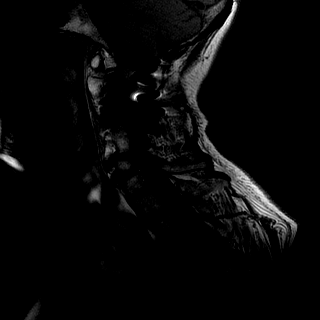
[im 9/11]
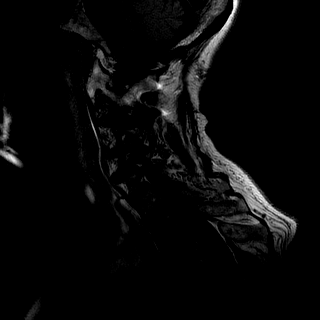
[im 11/11]
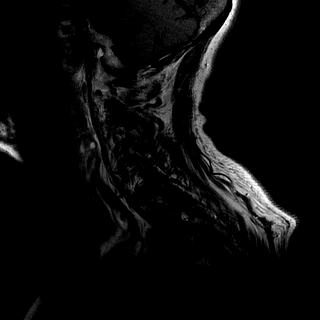

[Series 4: STIR · sagittal · 3.0mm · 0.86mm/px · 7 of 11 slices shown]
[im 1/11]
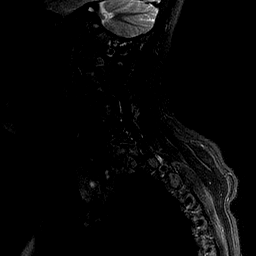
[im 2/11]
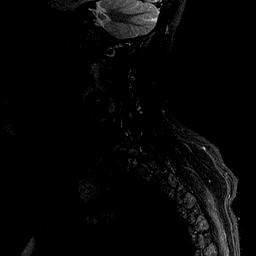
[im 4/11]
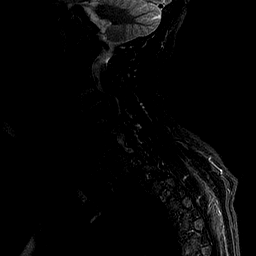
[im 6/11]
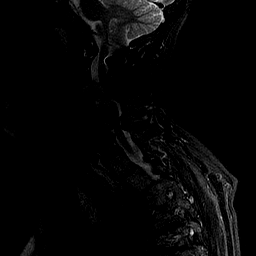
[im 7/11]
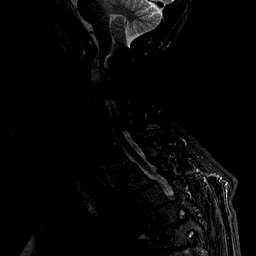
[im 9/11]
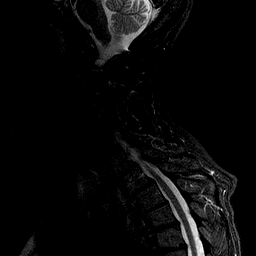
[im 11/11]
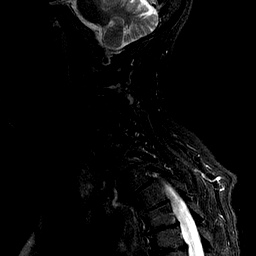

[Series 5: provider echo axial · axial · 3.0mm · 0.39mm/px · z∈[-15,+70]mm · 8 of 24 slices shown]
[im 1/24]
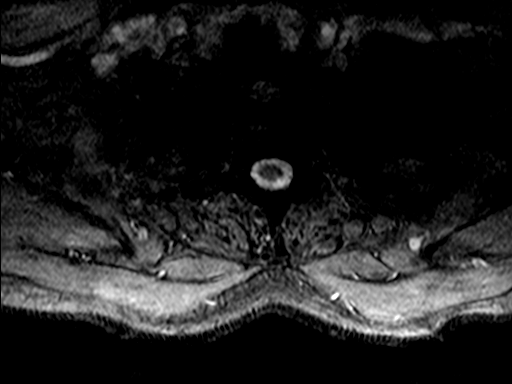
[im 4/24]
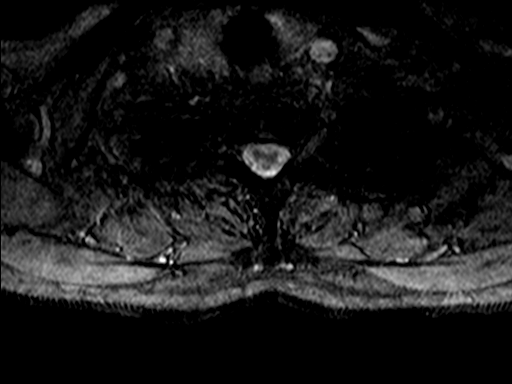
[im 8/24]
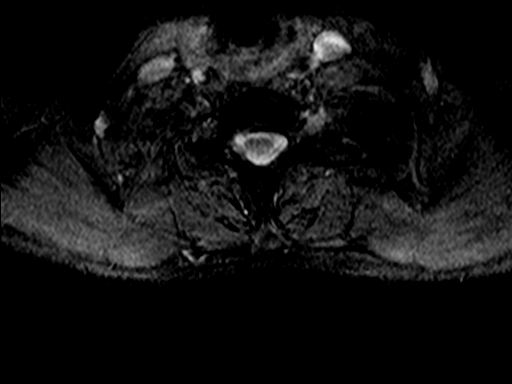
[im 11/24]
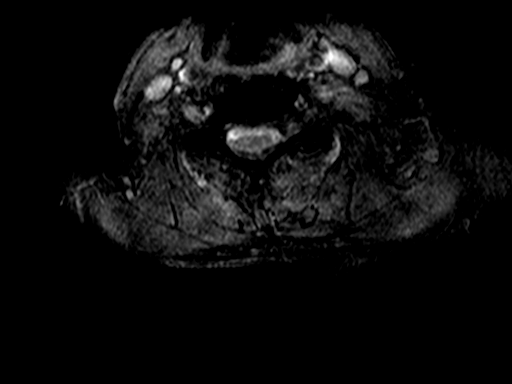
[im 13/24]
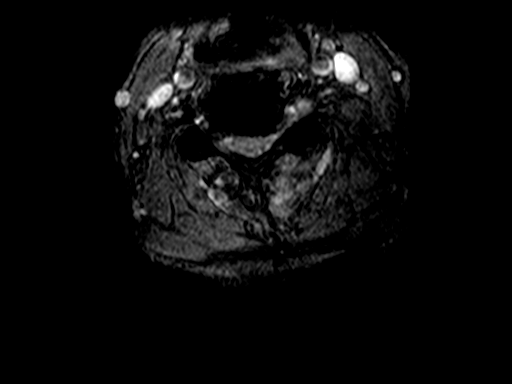
[im 16/24]
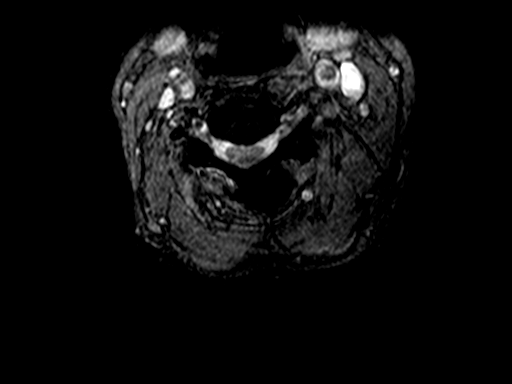
[im 20/24]
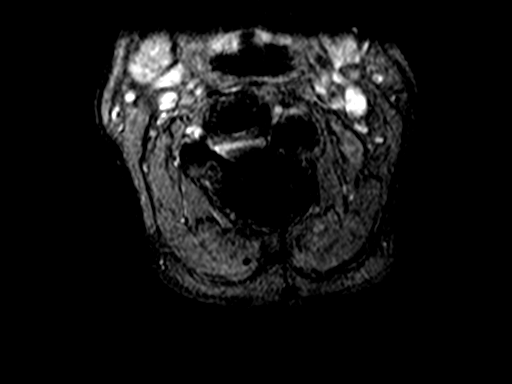
[im 24/24]
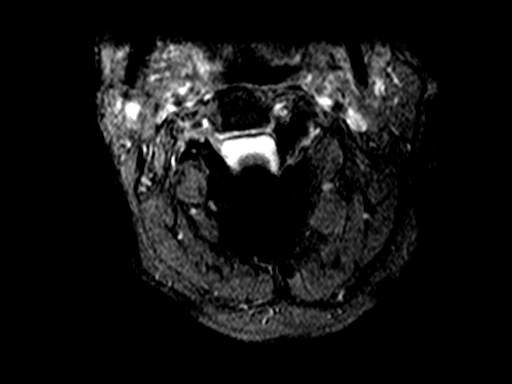

[Series 6: T1 · axial · 3.0mm · 0.78mm/px · z∈[-15,+70]mm · 14 of 23 slices shown (2 of 2)]
[im 1/23]
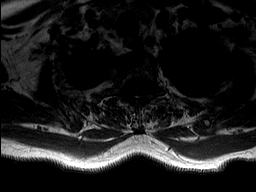
[im 2/23]
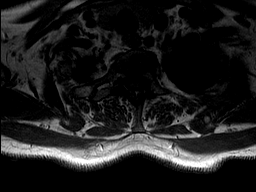
[im 4/23]
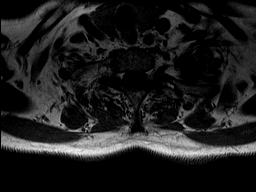
[im 6/23]
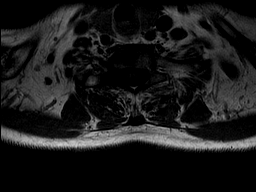
[im 7/23]
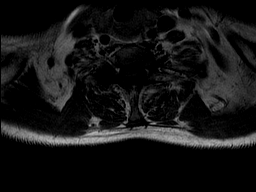
[im 9/23]
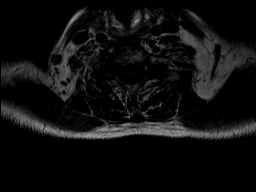
[im 11/23]
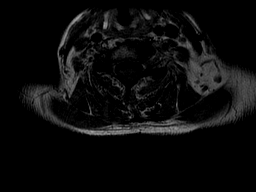
[im 12/23]
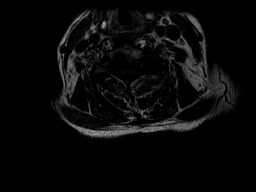
[im 14/23]
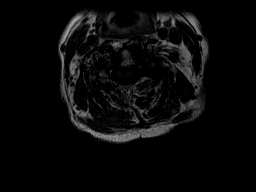
[im 16/23]
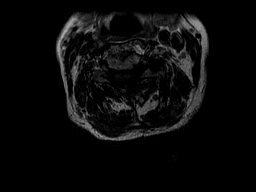
[im 17/23]
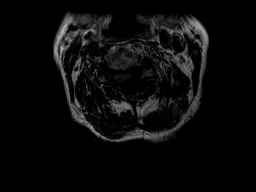
[im 19/23]
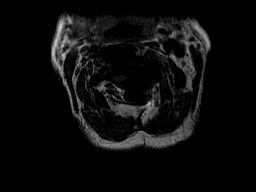
[im 21/23]
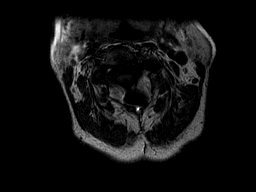
[im 23/23]
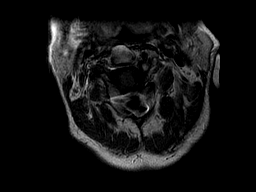

[42 of 48 positions shown; findings below may reference images not displayed]

MRI REPORT

DIAGNOSTIC STUDIES

EXAM
Cervical spine MRI.

INDICATION
cervical radiculopathy, bil finger numbness
NECK PAIN WITH NUMBNESS AND TINGLING TO BOTH HANDS, PT STATES THAT SHE WAS
BORN WITHOUT A ODONTOID AND THEY MADE ONE FOR HER IN 6306.

TECHNIQUE
Noncontrast cervical spine MRI protocol.

COMPARISONS
Cervical spine radiographs 27 May, 2016.

FINDINGS
Motion artifact complicates interpretation. There is grade 1 anterolisthesis of C3 on C4 with
partial ankylosis of C4 on C5. There is abnormal appearance of the odontoid which correlates with
the provided history of a surgically created odontoid process. Linear low T1 and T2 signal at the
inferior aspect of C3 is present with associated low T1 and diffuse bright T2 signal also noted.
Findings are compatible turning for a stress fracture. There is advanced disc degeneration at C5-6
and C6-7. Susceptibility artifact from prior surgery complicates interpretation, particularly at
the C2-3 level posteriorly. The cervical cord shows no syrinx. There is no compelling evidence of
cord edema or myelomalacia.
C2-3: Disc desiccation. Mild posterior disc osteophyte. There is minimal effacement of the
anterior thecal sac. There is moderate left and no significant right neural foraminal narrowing.
C3-4: Severe disc degeneration. The bone edema in the C3 vertebral body in is again concerning for
nondisplaced fracture toward the inferior endplate. There is effacement of the anterior thecal sac.
Severe spinal stenosis with extensive cord flattening. Uncovertebral joint hypertrophy. Facet
arthropathy. Mild bilateral neural foraminal narrowing.
C4-5: Partial ankylosis. There is a broad-based posterior disc osteophyte with effacement of the
anterior thecal sac and mild to moderate spinal stenosis. Bilateral facet arthropathy. No
significant neural foraminal stenosis.
C5-6: Advanced disc degeneration with bilateral facet arthropathy. Broad-based posterior disc
osteophyte with moderate spinal stenosis and cord flattening. The uncovertebral joint hypertrophy
and facet arthropathy contribute to mild to moderate bilateral neural foraminal narrowing.
C6-7: Grade 1 anterolisthesis of C6 on C7 with advanced disc degeneration. Concentric disc
osteophyte. Uncovertebral joint hypertrophy. Mild left and moderate to severe right neural
foraminal narrowing.
C7-T1: Disc height is narrowed and there is mild concentric disc bulging. There is no significant
spinal or foraminal stenosis.

IMPRESSION
Postsurgical changes of the cervical spine with susceptibility artifact. There is multilevel disc
degeneration which is overall moderate to severe. There is a nondisplaced fracture of the inferior
endplate of C3 with associated bone edema. Severe spinal stenosis at C3-4 with other areas of
spinal and neural foraminal narrowing also noted as detailed above.

## 2016-10-12 IMAGING — CR PELVIS
5 series · 5 of 5 positions shown · non-contrast
Comparison: none

[l-spine ap]
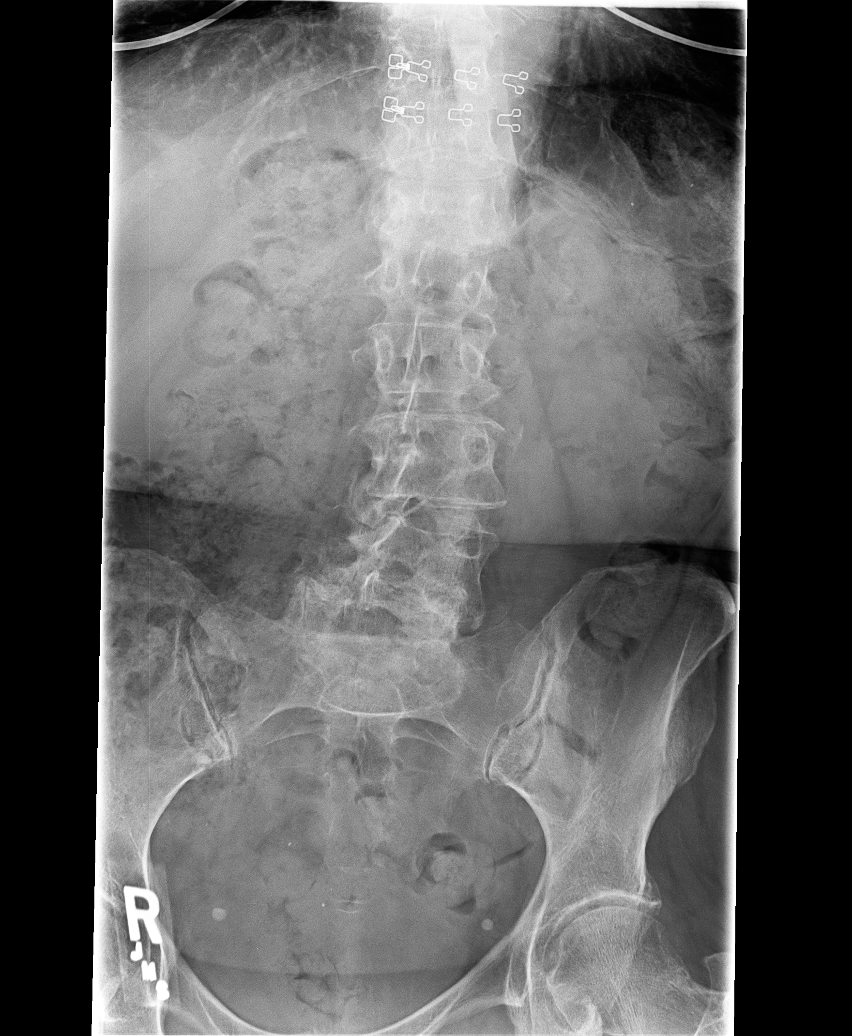

[l-spine obl (1 of 2)]
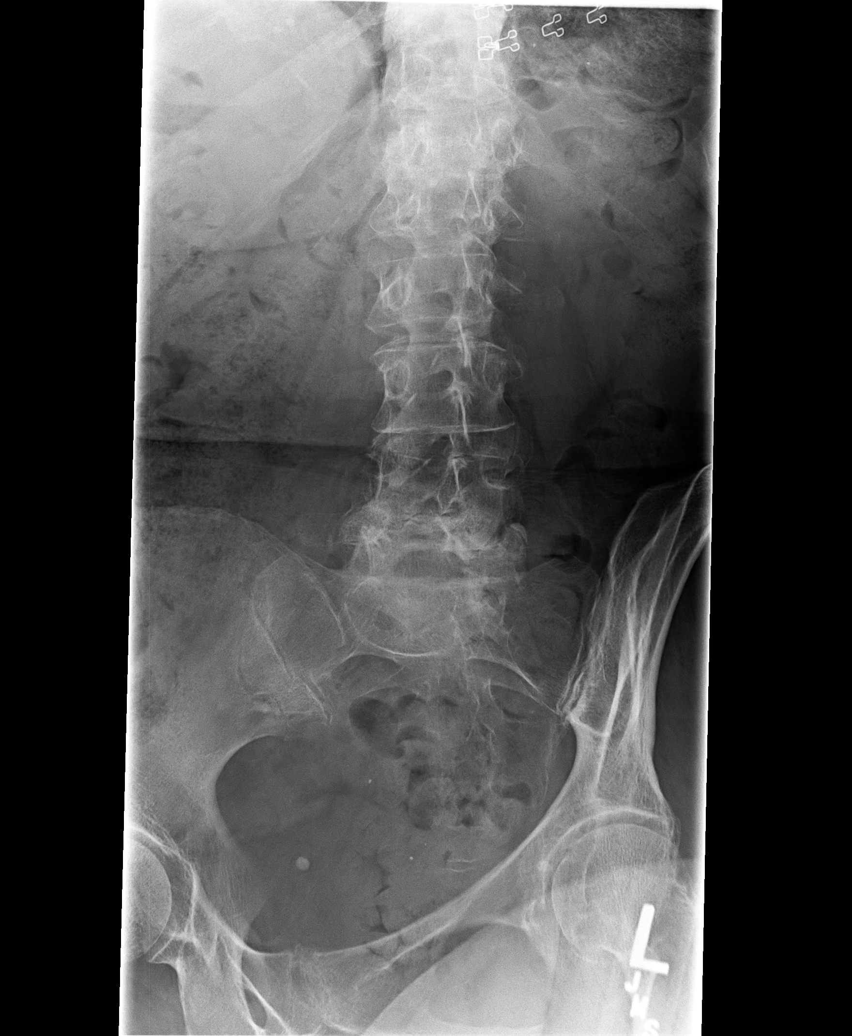

[l-spine obl (2 of 2)]
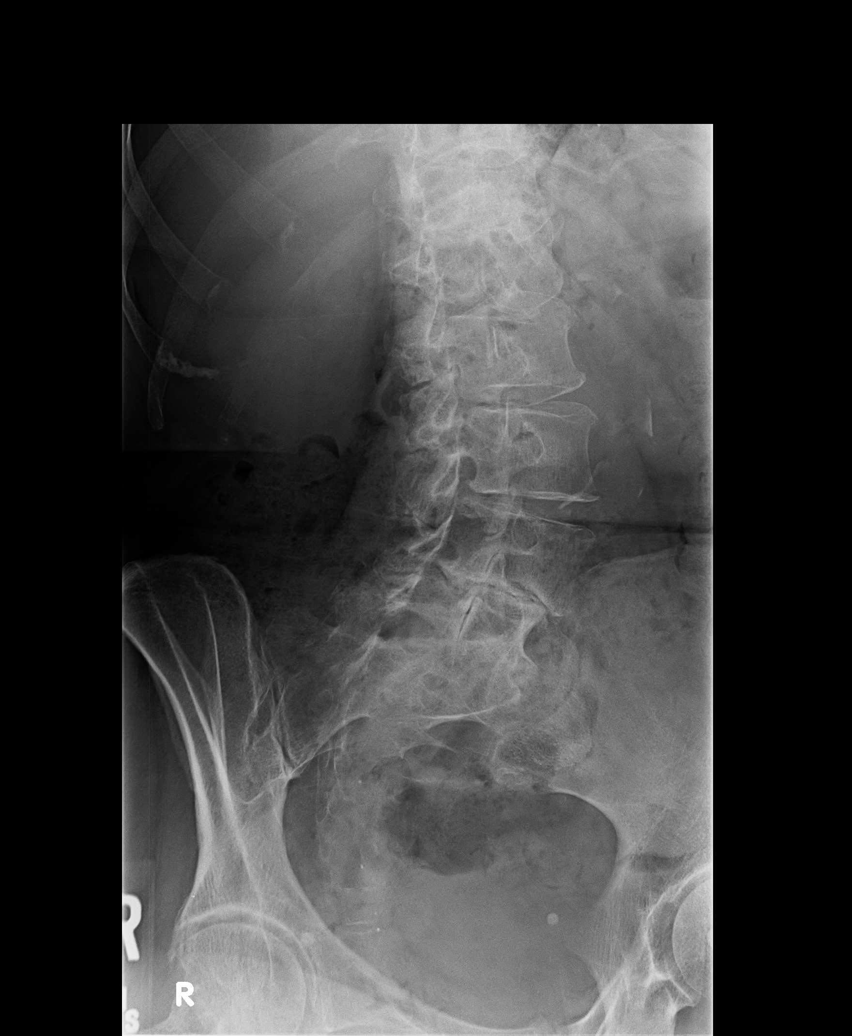

[l-spine lat]
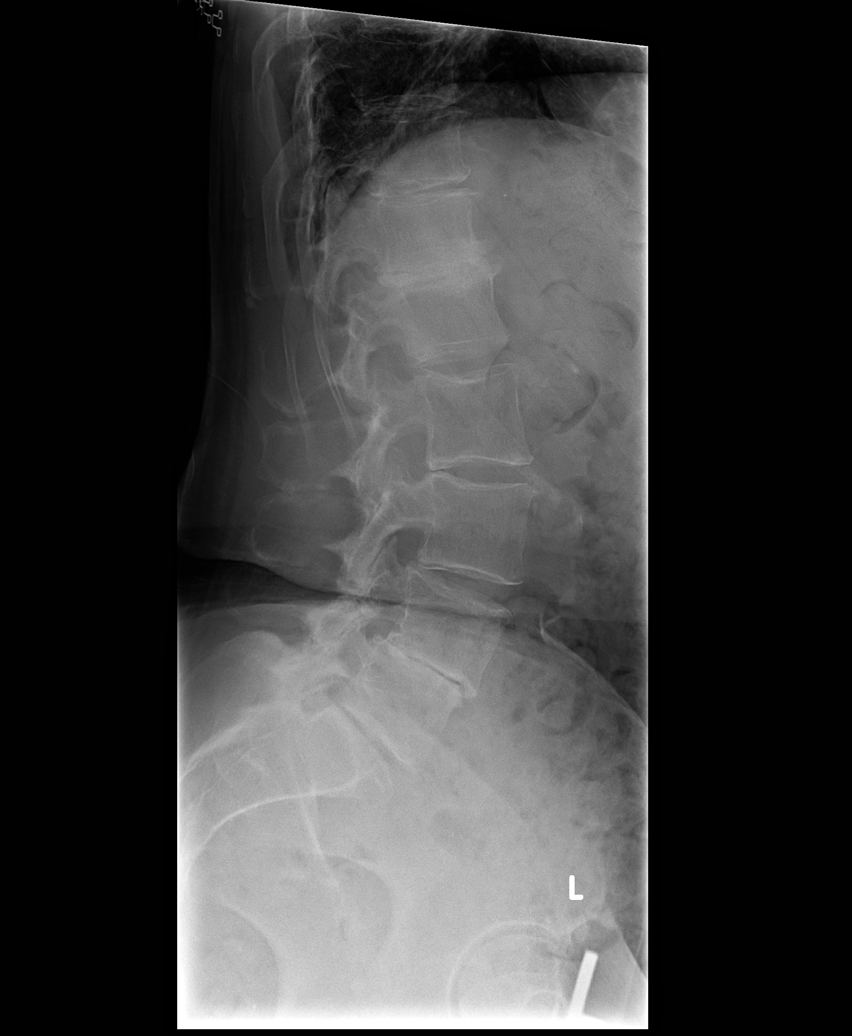

[l-spine l5-s1]
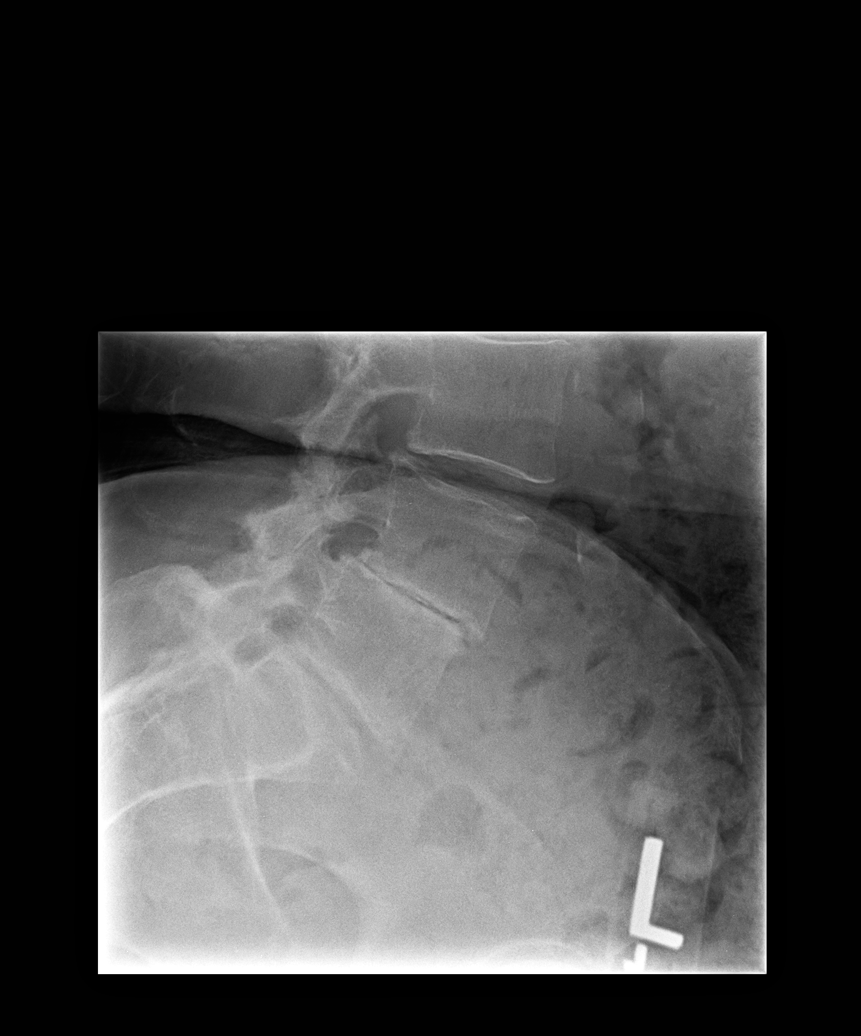

[5 of 5 positions shown; findings below may reference images not displayed]

DIAGNOSTIC STUDIES

EXAM

RADIOLOGIC EXAMINATION, SPINE, LUMBOSACRAL, 2 OR 3 VIEWS, CPT 47100

INDICATION

LOW BACK PAIN
LOWER BACK PAIN. HX OF FALL X6 MONTHS. PT STATES HAVING TINGLING IN FINGERS
AFTER FALL

TECHNIQUE

Frontal, lateral and oblique views of the lumbar spine were obtained.

COMPARISONS

No priors available for comparison.

FINDINGS

There is mild scoliosis with convexity to the left, an apex at the level of L3 and a Cobb angle of
10 degrees. Sclerotic pedicles at the level of L5. Minimal anterolisthesis of L4. No acute
fracture. Severe loss of disc height with associated sclerosis at the level of T12/L1, L4/5 and L5/

IMPRESSION

Mild scoliosis. Grade 1 spondylolisthesis at the level of L4.

Severe degenerative disc disease at the level of T12/L1, L4/5 and L5/S1.

Further evaluation with an MRI is suggested if the patient's symptoms persist and if clinically
indicated.

## 2016-10-13 IMAGING — MR L-spine^LUMBAR BLOCK
5 series · 32 of 48 positions shown · non-contrast
Comparison: none

[Series 3: T2 · sagittal · 4.0mm · 0.62mm/px · 8 of 13 slices shown (1 of 2)]
[im 1/13]
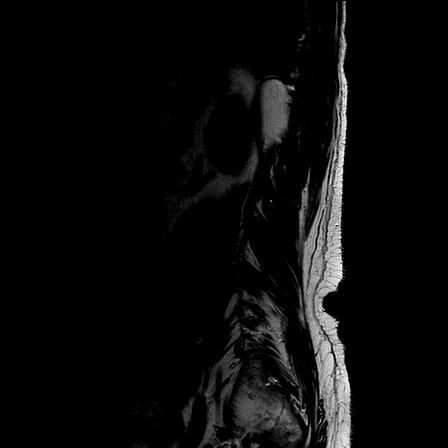
[im 2/13]
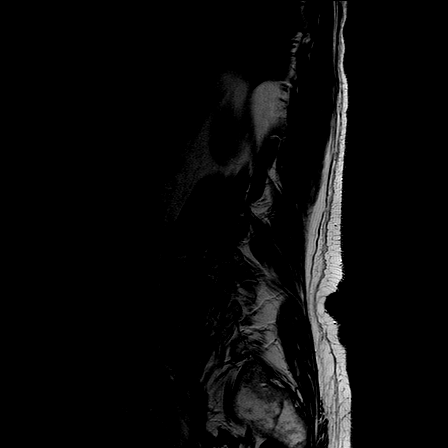
[im 4/13]
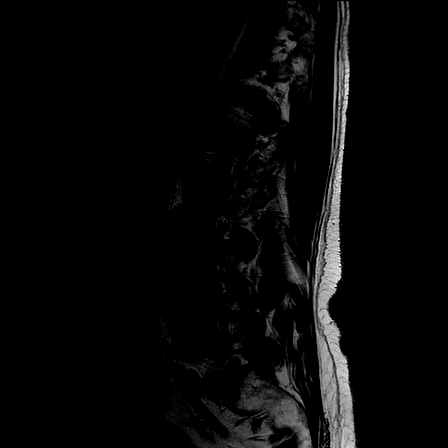
[im 6/13]
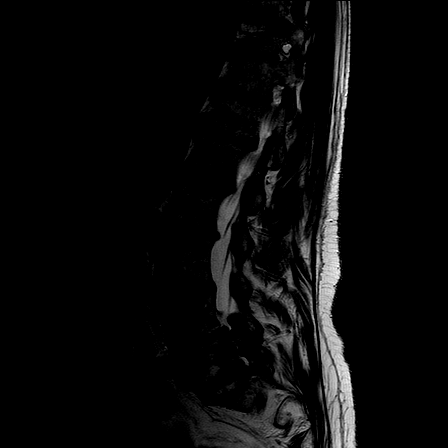
[im 7/13]
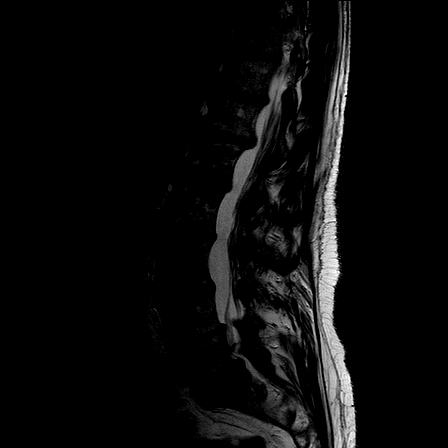
[im 9/13]
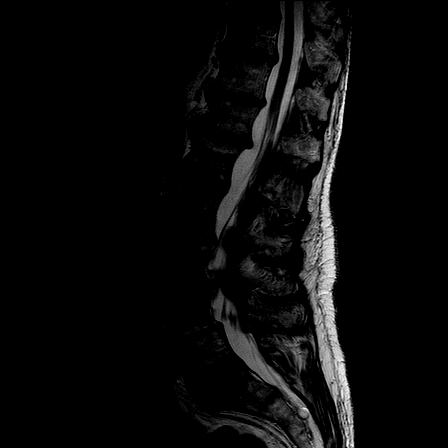
[im 11/13]
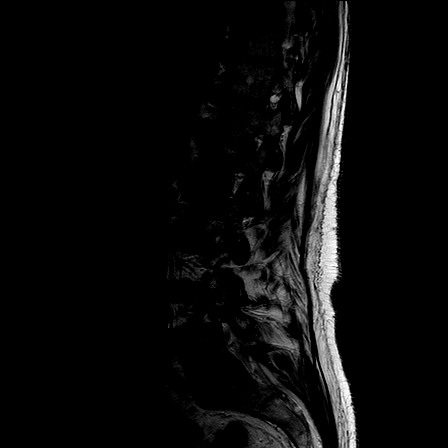
[im 13/13]
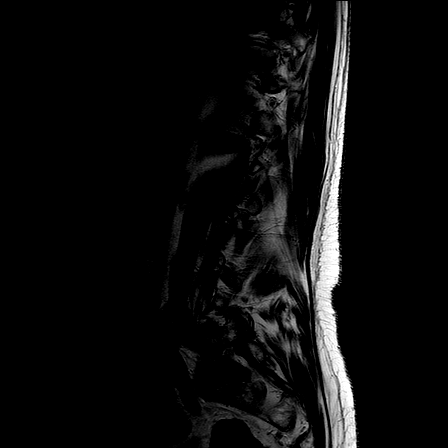

[Series 4: T1 · sagittal · 4.0mm · 0.73mm/px · 6 of 12 slices shown (1 of 2)]
[im 1/12]
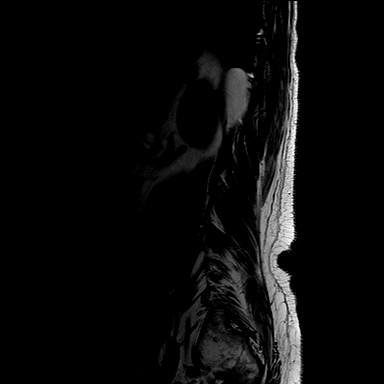
[im 3/12]
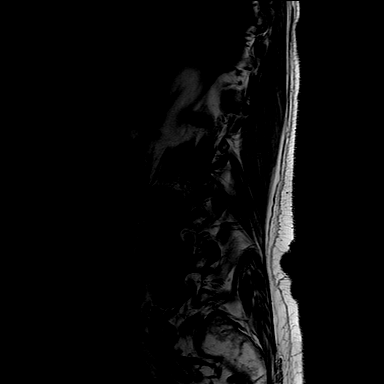
[im 5/12]
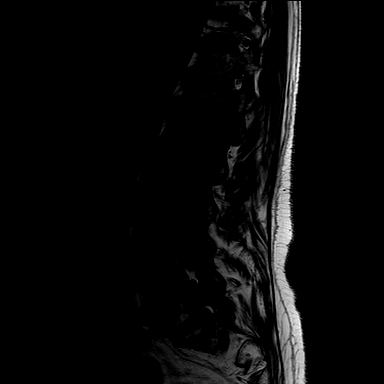
[im 7/12]
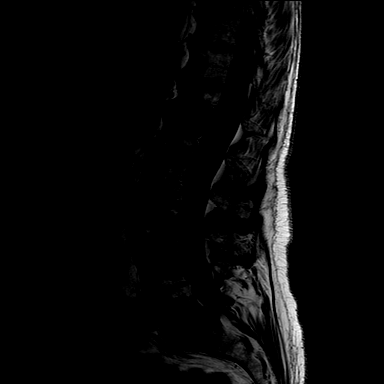
[im 9/12]
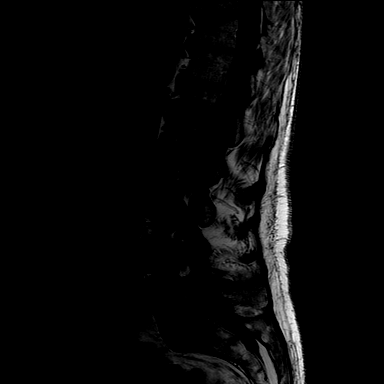
[im 12/12]
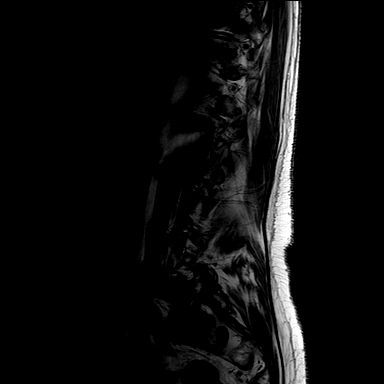

[Series 5: STIR · sagittal · 4.0mm · 0.55mm/px · 2 of 12 slices shown]
[im 1/12]
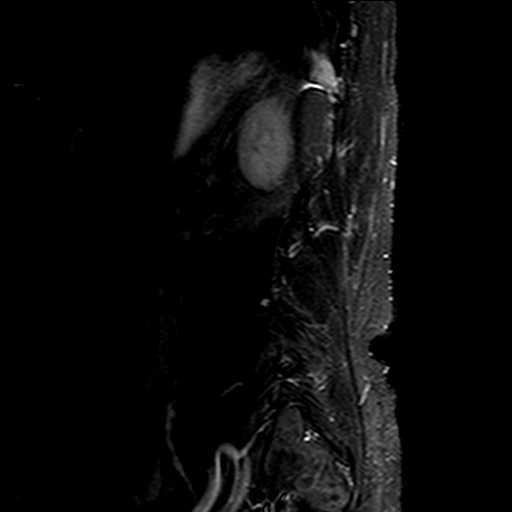
[im 3/12]
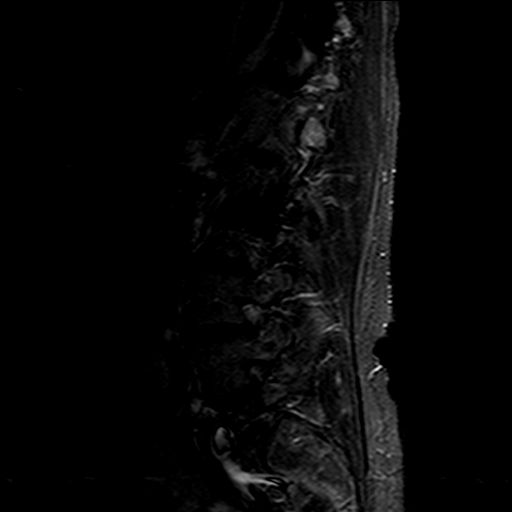

[Series 6: T1 · axial · 4.5mm · 0.86mm/px · z∈[-124,+62]mm · 8 of 26 slices shown (2 of 2)]
[im 1/26]
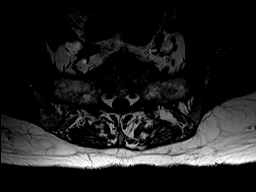
[im 4/26]
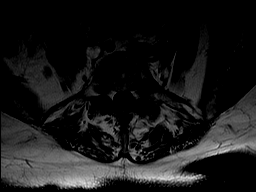
[im 8/26]
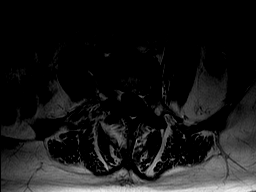
[im 12/26]
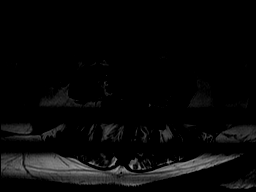
[im 14/26]
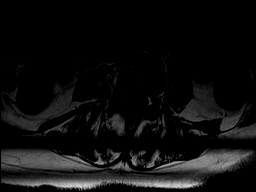
[im 18/26]
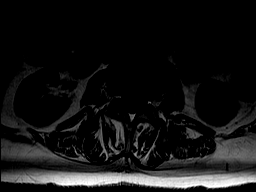
[im 22/26]
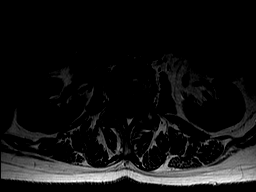
[im 26/26]
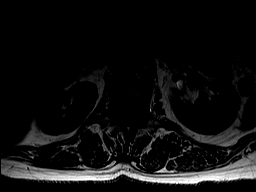

[Series 7: T2 · axial · 4.5mm · 0.49mm/px · z∈[-124,+62]mm · 8 of 26 slices shown (2 of 2)]
[im 1/26]
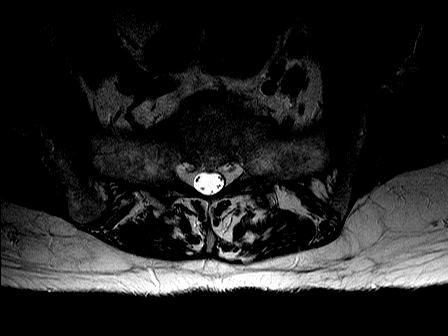
[im 4/26]
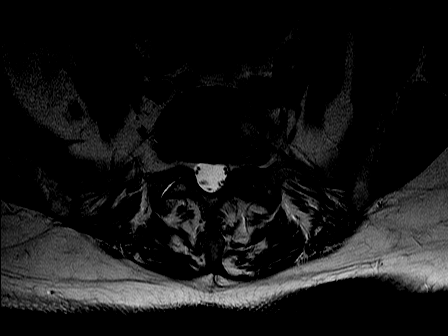
[im 8/26]
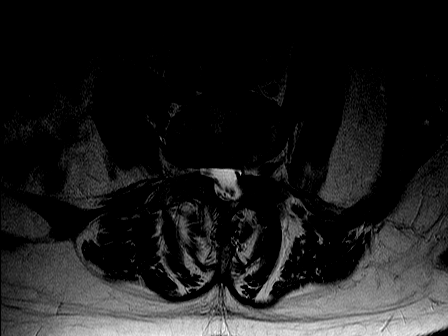
[im 12/26]
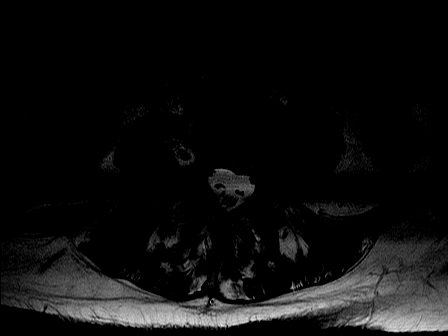
[im 14/26]
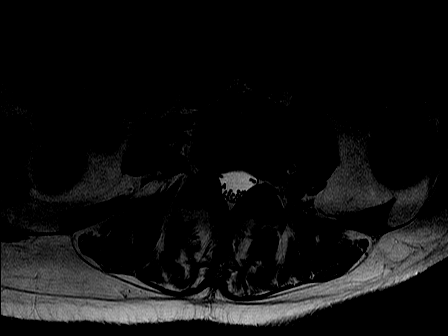
[im 18/26]
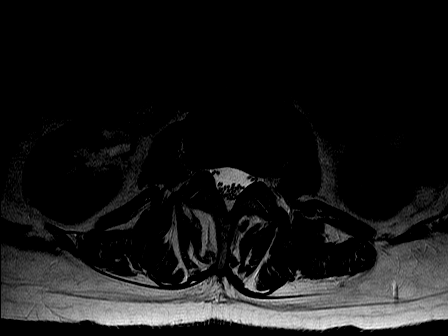
[im 22/26]
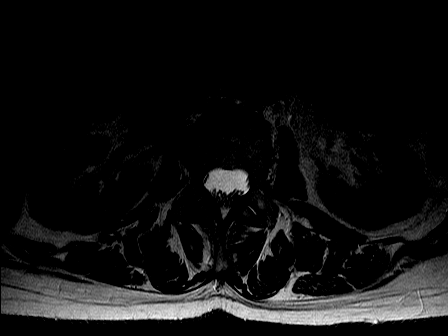
[im 26/26]
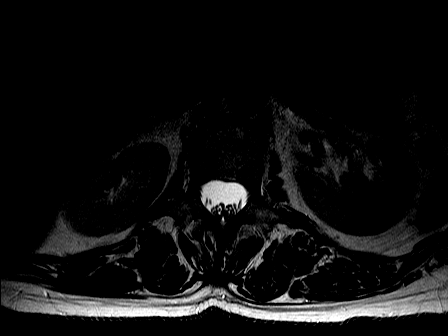

[32 of 48 positions shown; findings below may reference images not displayed]

MRI REPORT

DIAGNOSTIC STUDIES

EXAM

MR LUMBAR SPINE

INDICATION

low back pain with radiuculopathy
Chronic low back pain, worsening with radiculopathy right > left.
BG

TECHNIQUE

2D multislice T1 and T2 SE weighted images are obtained in the sagittal and axial projections.
Sagittal STIR images are obtained.

FINDINGS

The conus medullaris is well visualized and has a normal MR appearance; its tip is at T12-L1.
Multilevel degenerative disc disease is present with disc desiccation, disc height loss, and
endplate changes of degenerative disc disease. This is greatest at L4-5, L5-S1, and T12-L1.
Endplate changes of degenerative disc disease are present. Rare hemangioma is identified.
Visualized marrow is otherwise unremarkable. There is minimal, grade 1, anterolisthesis of L4 on 5.
Retrolisthesis of T12 on L1 is present.

T10-11: Sagittal images demonstrate a broad-based disc and spur is present partially effacing the
anterior thecal sac. There is suggestion of facet arthropathy.

T11-12: Minimal bulging annulus and spur is present. Parasagittal images demonstrate the neural
foramina to be patent.

T12-L1: There is minimal retrolisthesis of T12 on L1. A broad-based osteophyte/disc complex is
present partially effacing the anterior thecal sac. Parasagittal images demonstrate minimal
foraminal narrowing.

L1-2: No focal disc protrusion or central canal stenosis is identified. Minimal facet arthropathy
is present. Neural foramina are symmetric and patent.

L2-3: Mild broad-based bulging annulus and spur is present flattening the anterior thecal sac.
Very mild facet arthropathy is present with hypertrophic spurring and buckling of ligamentum
flavum. Findings are causing mild posterior lateral recess narrowing and very mild bilateral
foraminal narrowing.

L3-4: No focal disc protrusion nor central canal stenosis is identified. Facet arthropathy is
present with hypertrophic spurring and buckling of the ligamentum flavum. Minimal bilateral
foraminal narrowing is present.

L4-5: Minimal anterolisthesis of L4 on 5 is present. There is suggestion of a left pars defect.
The right is not as well visualized limiting its evaluation. Mild broad-based bulging annulus is
present. Severe facet arthropathy is present with hypertrophic spurring and buckling of the
ligamentum flavum. Findings are causing bilateral posterior lateral recess narrowing left greater
than right. Moderate bilateral foraminal narrowing is present.

L5-S1: No focal disc protrusion or central canal stenosis is identified. Minimal facet arthropathy
is present. Mild left foraminal narrowing is present. The right neural foramina is patent.

IMPRESSION
1. Bilateral posterior lateral/lateral recess narrowing (left greater than right) L4-5 causing
lateral spinal stenosis. Findings are secondary to its severe facet arthropathy.
2. Spondylitic changes as described above.

## 2016-12-28 MED ORDER — IOPAMIDOL 41 % IT SOLN
2.5 mL | Freq: Once | EPIDURAL | 0 refills | Status: CP
Start: 2016-12-28 — End: ?

## 2016-12-28 MED ORDER — TRIAMCINOLONE ACETONIDE 40 MG/ML IJ SUSP
80 mg | Freq: Once | EPIDURAL | 0 refills | Status: CP
Start: 2016-12-28 — End: ?

## 2017-01-22 IMAGING — CR CHEST
2 series · 2 of 2 positions shown · non-contrast
Comparison: none

[chest pa x-wise]
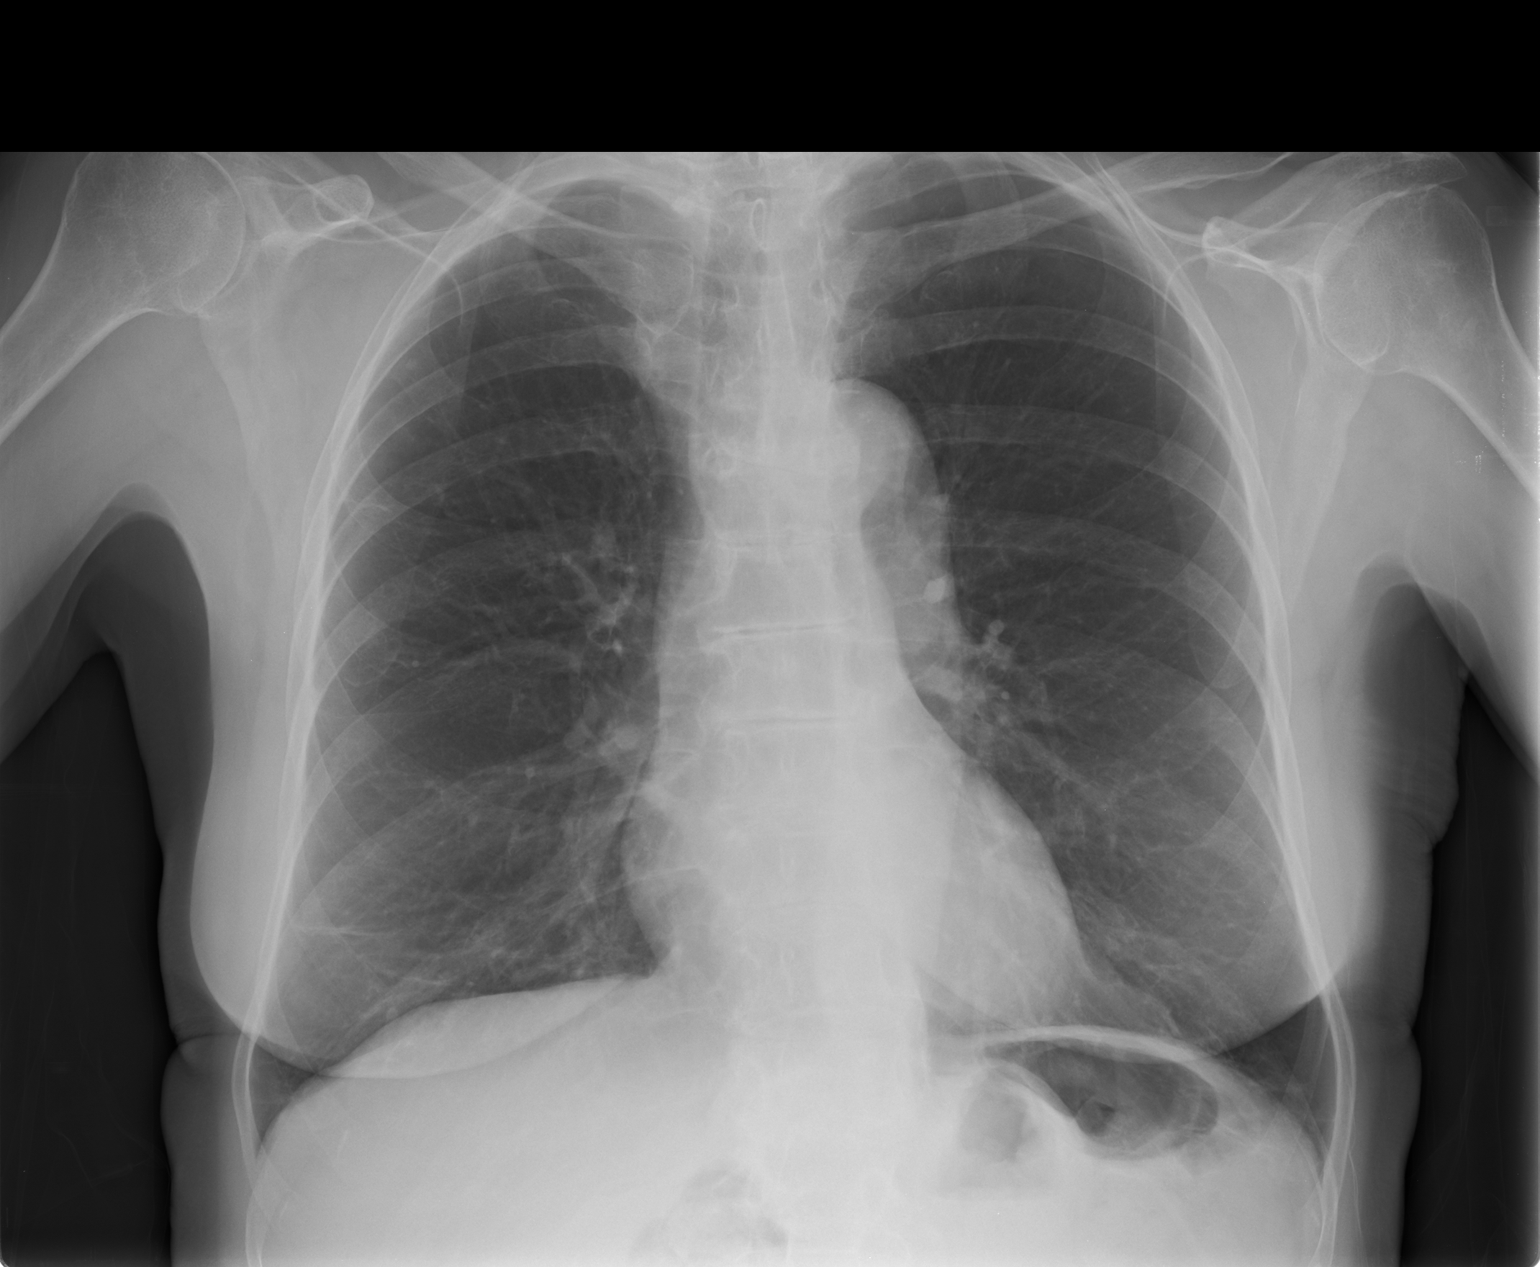

[chest lat]
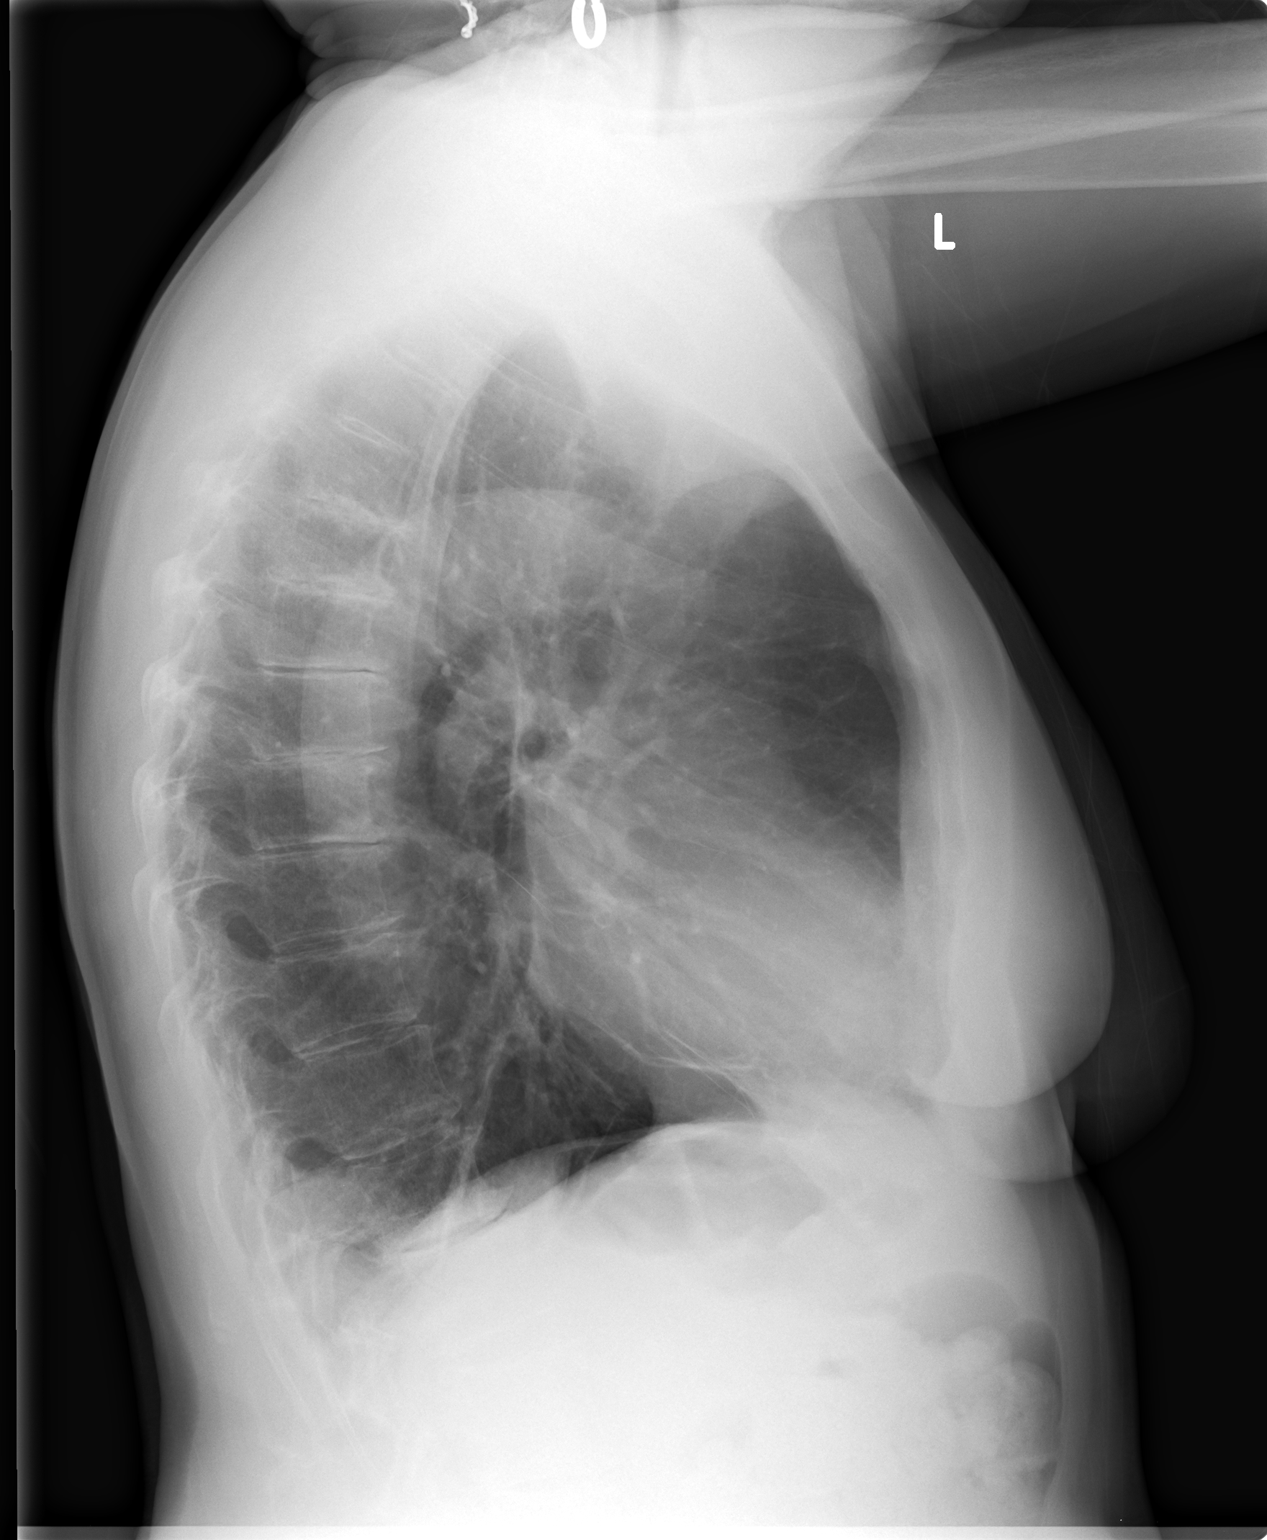

[2 of 2 positions shown; findings below may reference images not displayed]

DIAGNOSTIC STUDIES

EXAM

Chest.

INDICATION

coughing x 2 weeks, fatigue
C/O PRODUCTIVE COUGH X 2 WEEKS. MULTIPLE RECENT URI'S. DENIES CHRONIC LUNG
PROBLEMS. H/O HTN. HB

TECHNIQUE

PA and lateral chest views.

COMPARISONS

None.

FINDINGS

No consolidation, effusion, or pneumothorax. Mild pulmonary hyperinflation. The cardiomediastinal
silhouette is nonenlarged.

IMPRESSION

No consolidative airspace disease.

## 2017-01-28 MED ORDER — IOPAMIDOL 41 % IT SOLN
2.5 mL | Freq: Once | EPIDURAL | 0 refills | Status: CP
Start: 2017-01-28 — End: ?

## 2017-01-28 MED ORDER — TRIAMCINOLONE ACETONIDE 40 MG/ML IJ SUSP
80 mg | Freq: Once | EPIDURAL | 0 refills | Status: CP
Start: 2017-01-28 — End: ?

## 2017-03-16 ENCOUNTER — Ambulatory Visit: Admit: 2017-03-16 | Discharge: 2017-03-17 | Payer: MEDICARE

## 2017-03-16 DIAGNOSIS — M5412 Radiculopathy, cervical region: Secondary | ICD-10-CM

## 2017-03-16 NOTE — Progress Notes
[facility] CLINIC NOTE  Subjective     SUBJECTIVE: right lumbar radiculopathy, the injections have reduced pain by more than 50%.  She would like to have another scheduled.  Physical therapy has helped in reducing pain.         Review of Systems   Constitutional: Positive for activity change.   Genitourinary: Positive for enuresis.   Musculoskeletal: Positive for back pain, myalgias, neck pain and neck stiffness.   All other systems reviewed and are negative.      Current Outpatient Prescriptions:   ???  ascorbic acid(+) (VITAMIN C) 1,000 mg tablet, Take  by mouth., Disp: , Rfl:   ???  CALCIUM CARBONATE/VITAMIN D2 (CALCIUM + VITAMIN D PO),  </br>See Instructions, Maintenance, 600 mg 1 tab daily., 01/22/16 13:48:15 CDT, 0 Number of Refills, Instructions Replace Required Details, Disp: , Rfl:   ???  lisinopril (PRINIVIL, ZESTRIL) 2.5 mg tablet, Take  by mouth., Disp: , Rfl:   ???  MULTIVITAMIN PO,  </br>See Instructions, Maintenance, Q24H, 10/04/15 9:03:14 CST, 0 Number of Refills, Instructions Replace Required Details, Disp: , Rfl:   ???  NAPROXEN PO, Take  by mouth., Disp: , Rfl:   ???  tafluprost (PF) (ZIOPTAN (PF)) 0.0015 % dpet, 1 drop., Disp: , Rfl:   ???  timolol (BETIMOL) 0.5 % ophthalmic solution, Apply 1 drop to both eyes daily. use in affected eye(s), Disp: , Rfl:   ???  TURMERIC-TURMERIC ROOT EXTRACT PO, Take 1,000 mg by mouth daily., Disp: , Rfl:   ???  vitamin E 400 unit capsule, Take  by mouth., Disp: , Rfl:   ???  vitamins, B complex tab, Take  by mouth., Disp: , Rfl:   ???  vitamins, B complex tablet(+) (B COMPLEX) capsule, Take  by mouth., Disp: , Rfl:   Allergies   Allergen Reactions   ??? Codeine UNKNOWN     Physical Exam  Vitals:    03/16/17 1343   BP: 143/88   Pulse: 67   Resp: 18   Temp: 36.7 ???C (98 ???F)   TempSrc: Oral   SpO2: 98%   Weight: 61.2 kg (135 lb)   Height: 152.4 cm (60)        Pain Score: Five  Body mass index is 26.37 kg/m???.  General: Alert and oriented, very pleasant female. HEENT showed extraocular muscles were intact and no other abnormalities.  Unlabored breathing.  Regular rate and rhythm on CV exam.   5/5 strength in bilateral upper and lower extremities.    Sensation is intact to light touch and equal in the upper and lower extremities.  Right lumbar tenderness to palpation         IMPRESSION:  1. Degenerative disc disease, lumbar    2. 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 Cervical radicular pain          PLAN:  Will schedule a right L4-5 and L5-S1 TFE and consult [facility] for TENS and LSO to address lumbar pain and instability due to weakness.

## 2017-03-17 DIAGNOSIS — M5416 Radiculopathy, lumbar region: ICD-10-CM

## 2017-03-17 DIAGNOSIS — M48062 Spinal stenosis, lumbar region with neurogenic claudication: ICD-10-CM

## 2017-03-17 DIAGNOSIS — M5136 Other intervertebral disc degeneration, lumbar region: Principal | ICD-10-CM

## 2017-04-06 ENCOUNTER — Encounter: Admit: 2017-04-06 | Discharge: 2017-04-06 | Payer: MEDICARE

## 2017-04-06 DIAGNOSIS — M5136 Other intervertebral disc degeneration, lumbar region: ICD-10-CM

## 2017-04-06 DIAGNOSIS — M5030 Other cervical disc degeneration, unspecified cervical region: ICD-10-CM

## 2017-04-06 DIAGNOSIS — M48062 Spinal stenosis, lumbar region with neurogenic claudication: Principal | ICD-10-CM

## 2017-04-06 DIAGNOSIS — M4802 Spinal stenosis, cervical region: ICD-10-CM

## 2017-04-06 NOTE — Telephone Encounter
Pt called with questions regarding to injections and [facility]. Pt has had good relief with 2nd epidural injection done on 5.24.18. Pt is scheduled for third injection on 8.2.18. Pt is still having neck and has yet to have any epidural injection. Pt was informed will need to wait until November for neck injection.  Pt states she continues to do stretches every morning and exercises. Informed pt will discuss with Dr. Suan Halterordell for further recommendations and referral and call her back tomorrow. Pt verbalized understanding and agreed.

## 2017-04-07 NOTE — Telephone Encounter
Spoke to Dr. Suan Halterordell, okay with [facility] per pt request. Regarding neck, pt will need to follow up with Dr. Samara DeistBraun for recommendation on injection. Pt verbalized understanding and agreed.

## 2017-04-08 ENCOUNTER — Encounter: Admit: 2017-04-08 | Discharge: 2017-04-08 | Payer: MEDICARE

## 2017-04-08 ENCOUNTER — Ambulatory Visit: Admit: 2017-04-08 | Discharge: 2017-04-09 | Payer: MEDICARE

## 2017-04-08 DIAGNOSIS — M5416 Radiculopathy, lumbar region: Principal | ICD-10-CM

## 2017-04-08 MED ORDER — TRIAMCINOLONE ACETONIDE 40 MG/ML IJ SUSP
80 mg | Freq: Once | EPIDURAL | 0 refills | Status: CP
Start: 2017-04-08 — End: ?
  Administered 2017-04-08: 13:00:00 80 mg via EPIDURAL

## 2017-04-08 MED ORDER — IOPAMIDOL 41 % IT SOLN
2.5 mL | Freq: Once | EPIDURAL | 0 refills | Status: CP
Start: 2017-04-08 — End: ?
  Administered 2017-04-08: 13:00:00 2.5 mL via EPIDURAL

## 2017-04-08 NOTE — Procedures
Attending Surgeon: Gabriel RungEdward B Allessandra Bernardi, MD    Anesthesia: Local    Pre-Procedure Diagnosis:   1. Degenerative disc disease, lumbar    2. Lumbar radiculopathy        Post-Procedure Diagnosis:   1. Degenerative disc disease, lumbar    2. Lumbar radiculopathy        SNR/TF LMBR/SAC  Procedure: transforaminal epidural    Laterality: right  Location: lumbar - L4-5 and L3-4      Consent:   Consent obtained: written  Consent given by: patient  Risks discussed: allergic reaction, bleeding, infection, weakness and no change or worsening in pain  Alternatives discussed: alternative treatment     Universal Protocol:  Relevant documents: relevant documents present and verified  Test results: test results available and properly labeled  Imaging studies: imaging studies available  Required items: required blood products, implants, devices, and special equipment available  Site marked: the operative site was marked  Patient identity confirmed: Patient identify confirmed verbally with patient.      Time out: Immediately prior to procedure a "time out" was called to verify the correct patient, procedure, equipment, support staff and site/side marked as required      Procedures Details:   Indications: pain   Prep: chlorhexidine  Patient position: prone  Estimated Blood Loss: minimal  Specimens: none  Number of Levels: 2  Approach: right paramedian  Guidance: fluoroscopy  Needle size: 25 G  Injection procedure: Negative aspiration for blood  Patient tolerance: Patient tolerated the procedure well with no immediate complications. Pressure was applied, and hemostasis was accomplished.  Outcome: Pain improved  Comments: Kenalog 40 mg was injected at each location      Estimated blood loss: none or minimal  Specimens: none  Patient tolerated the procedure well with no immediate complications. Pressure was applied, and hemostasis was accomplished.

## 2017-04-08 NOTE — Progress Notes
[facility]  INTERVENTIONAL PAIN PROCEDURE HISTORY AND PHYSICAL    Chief Complaint   Patient presents with    Lower Back - Pain       HISTORY OF PRESENT ILLNESS: RIght lumbar radiculopathy, the last block helped more than 50% for 2-3 months    Past Medical History:   Diagnosis Date    Essential hypertension        No past surgical history on file.    family history includes Heart problem in her father.    Social History     Social History    Marital status: Single     Spouse name: N/A    Number of children: N/A    Years of education: N/A     Occupational History    Not on file.     Social History Main Topics    Smoking status: Never Smoker    Smokeless tobacco: Never Used    Alcohol use Yes    Drug use: No    Sexual activity: Not on file     Other Topics Concern    Not on file     Social History Narrative    No narrative on file       Allergies   Allergen Reactions    Codeine UNKNOWN       Vitals:    04/08/17 0756   BP: 165/78   Pulse: 56   Resp: 15   Temp: 36.7 C (98.1 F)   TempSrc: Oral   SpO2: 99%   Weight: 61.2 kg (135 lb)   Height: 154.9 cm (61")       REVIEW OF SYSTEMS: 10 point ROS obtained and negative except for right leg pain.      PHYSICAL EXAM:  General: Alert and oriented, very pleasant female.   HEENT showed extraocular muscles were intact and no other abnormalities.  Unlabored breathing.  Regular rate and rhythm on CV exam.   5/5 strength in bilateral upper and lower extremities.    Sensation is intact to light touch and equal in the upper and lower extremities.  Right lumbar tenderness to palpation      IMPRESSION:    1. Degenerative disc disease, lumbar    2. Lumbar radiculopathy         PLAN: Lumbar Transforaminal Steroid Injection at right L3-4 and L4-5

## 2017-04-09 ENCOUNTER — Ambulatory Visit: Admit: 2017-04-08 | Discharge: 2017-04-09 | Payer: MEDICARE

## 2017-04-09 DIAGNOSIS — M5416 Radiculopathy, lumbar region: Secondary | ICD-10-CM

## 2017-04-09 DIAGNOSIS — M5136 Other intervertebral disc degeneration, lumbar region: Principal | ICD-10-CM

## 2017-04-09 DIAGNOSIS — I10 Essential (primary) hypertension: ICD-10-CM

## 2017-06-15 ENCOUNTER — Ambulatory Visit: Admit: 2017-06-15 | Discharge: 2017-06-16 | Payer: MEDICARE

## 2017-06-15 ENCOUNTER — Ambulatory Visit: Admit: 2017-06-15 | Discharge: 2017-06-15 | Payer: MEDICARE

## 2017-06-15 ENCOUNTER — Encounter: Admit: 2017-06-15 | Discharge: 2017-06-15 | Payer: MEDICARE

## 2017-06-15 DIAGNOSIS — I10 Essential (primary) hypertension: Principal | ICD-10-CM

## 2017-06-15 DIAGNOSIS — M5416 Radiculopathy, lumbar region: ICD-10-CM

## 2017-06-15 MED ORDER — GABAPENTIN 300 MG PO CAP
300 mg | ORAL_CAPSULE | Freq: Two times a day (BID) | ORAL | 3 refills | Status: AC
Start: 2017-06-15 — End: 2017-10-04

## 2017-06-15 NOTE — Progress Notes
[facility] CLINIC NOTE  Subjective     SUBJECTIVE: Mindy Johnston presents in follow up for ongoing care regarding back and leg pain.  She is also experiencing bilateral hand and finger numbness and paresthesia, which has developed over the past year.  She is performing a physical therapy guided home exercise plan.  She is initiating an aquatic therapy plan.  She has a dexa scan pending.         Review of Systems    Current Outpatient Prescriptions:   ???  ascorbic acid(+) (VITAMIN C) 1,000 mg tablet, Take  by mouth., Disp: , Rfl:   ???  CALCIUM CARBONATE/VITAMIN D2 (CALCIUM + VITAMIN D PO),  </br>See Instructions, Maintenance, 600 mg 1 tab daily., 01/22/16 13:48:15 CDT, 0 Number of Refills, Instructions Replace Required Details, Disp: , Rfl:   ???  coenzyme Q10(+) 100 mg cap, Take 200 mg by mouth once., Disp: , Rfl:   ???  gabapentin (NEURONTIN) 300 mg capsule, Take one capsule by mouth twice daily., Disp: 60 capsule, Rfl: 3  ???  latanoprost (XALATAN) 0.005 % ophthalmic solution, Place 1 drop into or around eye(s) at bedtime daily., Disp: , Rfl:   ???  lisinopril (PRINIVIL, ZESTRIL) 2.5 mg tablet, Take  by mouth., Disp: , Rfl:   ???  MULTIVITAMIN PO,  </br>See Instructions, Maintenance, Q24H, 10/04/15 9:03:14 CST, 0 Number of Refills, Instructions Replace Required Details, Disp: , Rfl:   ???  NAPROXEN PO, Take  by mouth., Disp: , Rfl:   ???  other medication, 1 Dose daily. Medication Name & Strength:full spectrum hemp extract 500 mg daily, Disp: , Rfl:   ???  other medication, 1 Dose daily. apothecanna extra strength body cream, Disp: , Rfl:   ???  timolol (BETIMOL) 0.5 % ophthalmic solution, Apply 1 drop to both eyes daily. use in affected eye(s), Disp: , Rfl:   ???  TURMERIC-TURMERIC ROOT EXTRACT PO, Take 1,000 mg by mouth daily., Disp: , Rfl:   ???  vitamin E 400 unit capsule, Take  by mouth., Disp: , Rfl:   ???  vitamins, B complex tab, Take  by mouth., Disp: , Rfl:   ???  vitamins, B complex tablet(+) (B COMPLEX) capsule, Take  by mouth., Disp: , Rfl:   Allergies   Allergen Reactions   ??? Codeine UNKNOWN     Physical Exam  Vitals:    06/15/17 1250   BP: (P) 152/83   Pulse: (P) 68   Weight: 61.2 kg (135 lb)   Height: 154.9 cm (61)        Pain Score: Five  Body mass index is 25.51 kg/m???.  General: Alert and oriented, very pleasant female.   HEENT showed extraocular muscles were intact and no other abnormalities.  Unlabored breathing.  Regular rate and rhythm on CV exam.   5/5 strength in bilateral upper and lower extremities.    Sensation is intact to light touch and equal in the upper and lower extremities.  Right lumbar tenderness to palpation         IMPRESSION:  1. Degenerative disc disease, lumbar    2. Lumbar radiculopathy    3. Cervical radicular pain          PLAN:  Will start gabapentin 300 mg po bid.  Dr. Suan Halter is considering a CT Myelogram of the C, T and L spine.  If surgery is not an option may at some point consider a spinal cord stimulator trial.

## 2017-06-16 ENCOUNTER — Encounter: Admit: 2017-06-16 | Discharge: 2017-06-16 | Payer: MEDICARE

## 2017-06-16 DIAGNOSIS — Z9889 Other specified postprocedural states: ICD-10-CM

## 2017-06-16 DIAGNOSIS — M5136 Other intervertebral disc degeneration, lumbar region: Principal | ICD-10-CM

## 2017-06-16 DIAGNOSIS — M810 Age-related osteoporosis without current pathological fracture: ICD-10-CM

## 2017-06-16 DIAGNOSIS — M48062 Spinal stenosis, lumbar region with neurogenic claudication: ICD-10-CM

## 2017-06-16 NOTE — Telephone Encounter
Received call from Okey Regal with [facility] requesting for a water therapy order for pt. Informed Carol, Dr. Suan Halter does not give orders for water therapy.     06/16/2017 10:33 AM   Dr. Suan Halter spoke to pt regarding water therapy. [facility] 10[facility]18. Explained to pt why order is not given. [facility] and CB with questions/concerns.

## 2017-06-16 NOTE — Telephone Encounter
06/16/2017 2:15 PM   Pt LVM [facility] not [facility]. Pt will be doing this 3 days weekly.

## 2017-06-17 ENCOUNTER — Encounter: Admit: 2017-06-17 | Discharge: 2017-06-18 | Payer: MEDICARE

## 2017-06-17 ENCOUNTER — Encounter: Admit: 2017-06-17 | Discharge: 2017-06-17 | Payer: MEDICARE

## 2017-06-17 DIAGNOSIS — M810 Age-related osteoporosis without current pathological fracture: ICD-10-CM

## 2017-06-17 DIAGNOSIS — Z780 Asymptomatic menopausal state: ICD-10-CM

## 2017-06-17 DIAGNOSIS — Z13820 Encounter for screening for osteoporosis: Principal | ICD-10-CM

## 2017-06-21 ENCOUNTER — Encounter: Admit: 2017-06-21 | Discharge: 2017-06-21 | Payer: MEDICARE

## 2017-06-21 DIAGNOSIS — M48062 Spinal stenosis, lumbar region with neurogenic claudication: ICD-10-CM

## 2017-06-21 DIAGNOSIS — M810 Age-related osteoporosis without current pathological fracture: Principal | ICD-10-CM

## 2017-06-21 DIAGNOSIS — Z9889 Other specified postprocedural states: ICD-10-CM

## 2017-06-21 NOTE — Progress Notes
Interventional Radiology Outpatient Scheduling Checklist      1.  Procedure:   Cervical, Thoracic and Lumbar Myelogram.      2.  Date of Procedure:   06/30/17      3.  Arrival Time:   1045      4.  Procedure Time:  1200      5.  Correct Procedural Room Assignment:  IR CA Room #2      6.  Blood Thinners Triaged and instructed per protocol: N/A      7.  Order Verified: Yes      8.  Patient informed regarding procedure: Yes      9.  Patient instructed to have a driver:  Yes     10.  Patient instructed on NPO status: No solids 8 hours prior to procedure 0400, clear liquids till 2 hours prior to procedure 1000, then @ 2 hours prior to procedure NPO.      11.  Specimen needed: Y/N: NO     12.  Allergies Verified:  Y/N:  Yes     13.  Iodine Allergy: Y/N:  If yes and receiving Iodine has the patient been premedicated: Y/N: No     14.  Does the patient have labs according to IR procedural policy: Y/N: Yes     15.  Will the patient need to be admitted/possible admission: Y/N:   NA     16.  If YES to possible admission has the patient been instructed: Y/N: NA     17.  Patient States Understanding:  Y/N  Yes     18.  Hx OSA:  Y/N: Pt instructed to bring PAP Machine:  N/A

## 2017-06-30 ENCOUNTER — Ambulatory Visit: Admit: 2017-06-30 | Discharge: 2017-06-30 | Payer: MEDICARE

## 2017-06-30 DIAGNOSIS — M4312 Spondylolisthesis, cervical region: ICD-10-CM

## 2017-06-30 DIAGNOSIS — M5136 Other intervertebral disc degeneration, lumbar region: ICD-10-CM

## 2017-06-30 DIAGNOSIS — M5031 Other cervical disc degeneration,  high cervical region: Principal | ICD-10-CM

## 2017-06-30 DIAGNOSIS — Z9889 Other specified postprocedural states: ICD-10-CM

## 2017-06-30 DIAGNOSIS — Z981 Arthrodesis status: ICD-10-CM

## 2017-06-30 DIAGNOSIS — M5134 Other intervertebral disc degeneration, thoracic region: ICD-10-CM

## 2017-06-30 DIAGNOSIS — M810 Age-related osteoporosis without current pathological fracture: ICD-10-CM

## 2017-06-30 DIAGNOSIS — M48062 Spinal stenosis, lumbar region with neurogenic claudication: Secondary | ICD-10-CM

## 2017-06-30 MED ORDER — IOPAMIDOL 61 % IT SOLN
15 mL | Freq: Once | INTRATHECAL | 0 refills | Status: CP
Start: 2017-06-30 — End: ?
  Administered 2017-06-30: 20:00:00 15 mL via INTRATHECAL

## 2017-06-30 NOTE — Other
Immediate Post Procedure Note    Date:  06/30/2017                                         Attending Physician:   Dr. Anise Salvo  Performing Provider:  Truitt Merle, MD    Consent:  Consent obtained from patient.  Time out performed: Consent obtained, correct patient verified, correct procedure verified, correct site verified, patient marked as necessary.  Pre/Post Procedure Diagnosis:  Pain  Indications:  Cervical/thoracic/lumbar myelogram requested.    Anesthesia: Local 3  mL 1% lidocaine without epinephrine  Procedure(s):  Fluoro guided C/T/L myelogram.  Findings:  Successful fluoro guided C/T/L myelogram. No immediate complications.    Estimated Blood Loss:  None/Negligible  Specimen(s) Removed/Disposition:  None  Complications: None  Patient Tolerated Procedure: Well  Post-Procedure Condition:  stable    Truitt Merle, MD

## 2017-06-30 NOTE — H&P (View-Only)
Pre Procedure History and Physical/Sedation Plan      Procedure Date:  06/30/2017    Planned Procedure(s): Cervical, thoracic and lumbar myelogram    Indication for exam: Pain  ________________________________________________________________    Chief Complaint:   See above     Previous Anesthetic/Sedation History:  NA.     Allergies:  Codeine  Medications:  Scheduled Meds:Continuous Infusions:  PRN and Respiratory Meds:       Vital Signs:  Last Filed Vital Signs: 24 Hour Range     BP: ()/()   ABP: ()/()      Sedation/Medication Plan: Lidocaine  Personal history of sedation complications: NA.   Family history of sedation complications: NA.   Medications for Reversal: None  Discussion/Reviews:  Physician has discussed risks and alternatives of this type of sedation and above planned procedures with patient    NPO Status: Acceptable    Pregnancy Status: Not Pregnant    Lab/Radiology/Other Diagnostic Tests  Labs:  Relevant labs reviewed    I have examined the patient, and there are no significant changes in their condition, from the previous H&P performed on 06/15/17.    Toya Smothers, [facility]  Pager [phone]

## 2017-07-07 ENCOUNTER — Ambulatory Visit: Admit: 2017-07-07 | Discharge: 2017-07-08 | Payer: MEDICARE

## 2017-07-07 ENCOUNTER — Encounter: Admit: 2017-07-07 | Discharge: 2017-07-07 | Payer: MEDICARE

## 2017-07-07 DIAGNOSIS — I10 Essential (primary) hypertension: Principal | ICD-10-CM

## 2017-07-07 DIAGNOSIS — M48062 Spinal stenosis, lumbar region with neurogenic claudication: ICD-10-CM

## 2017-07-08 DIAGNOSIS — M4802 Spinal stenosis, cervical region: Principal | ICD-10-CM

## 2017-07-13 ENCOUNTER — Encounter: Admit: 2017-07-13 | Discharge: 2017-07-13 | Payer: MEDICARE

## 2017-07-13 NOTE — Telephone Encounter
07/13/2017 8:52 AM   Spoke to pt. Per Dr. Suan Halterordell, Dr. Samara DeistBraun will con't to manage gabapentin. Pt informed of this and Dr. Suan Halterordell will follow up at next appt. Pt aware to call with any concerns/questions.

## 2017-07-16 ENCOUNTER — Encounter: Admit: 2017-07-16 | Discharge: 2017-07-17 | Payer: MEDICARE

## 2017-07-16 DIAGNOSIS — R69 Illness, unspecified: Principal | ICD-10-CM

## 2017-07-16 IMAGING — CR PELVIS
3 series · 3 of 3 positions shown · non-contrast
Comparison: none

[pelvis]
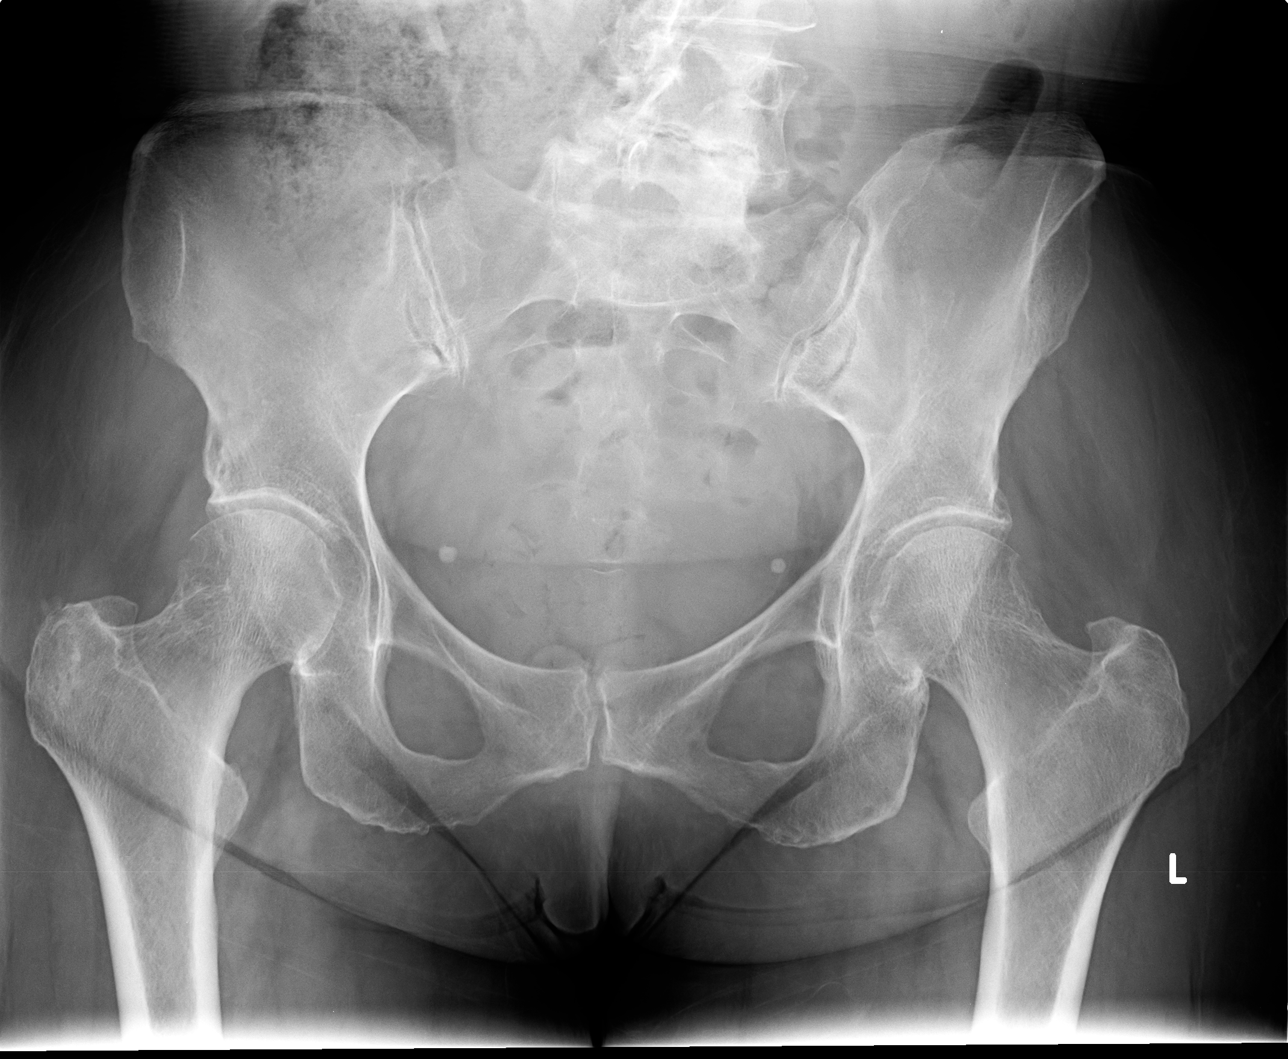

[hip ap]
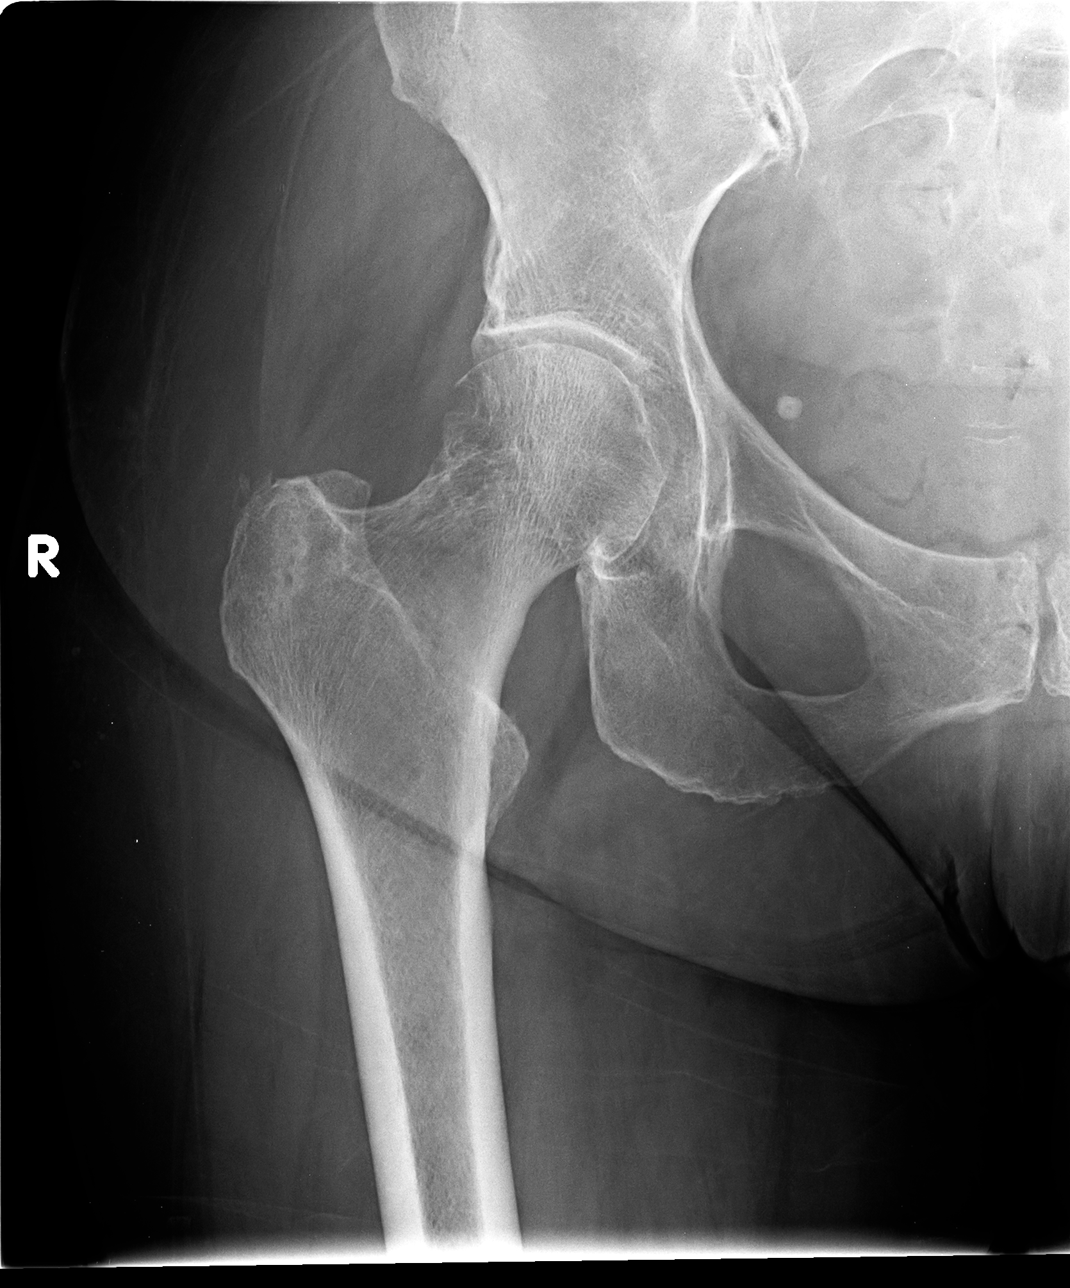

[hip frog]
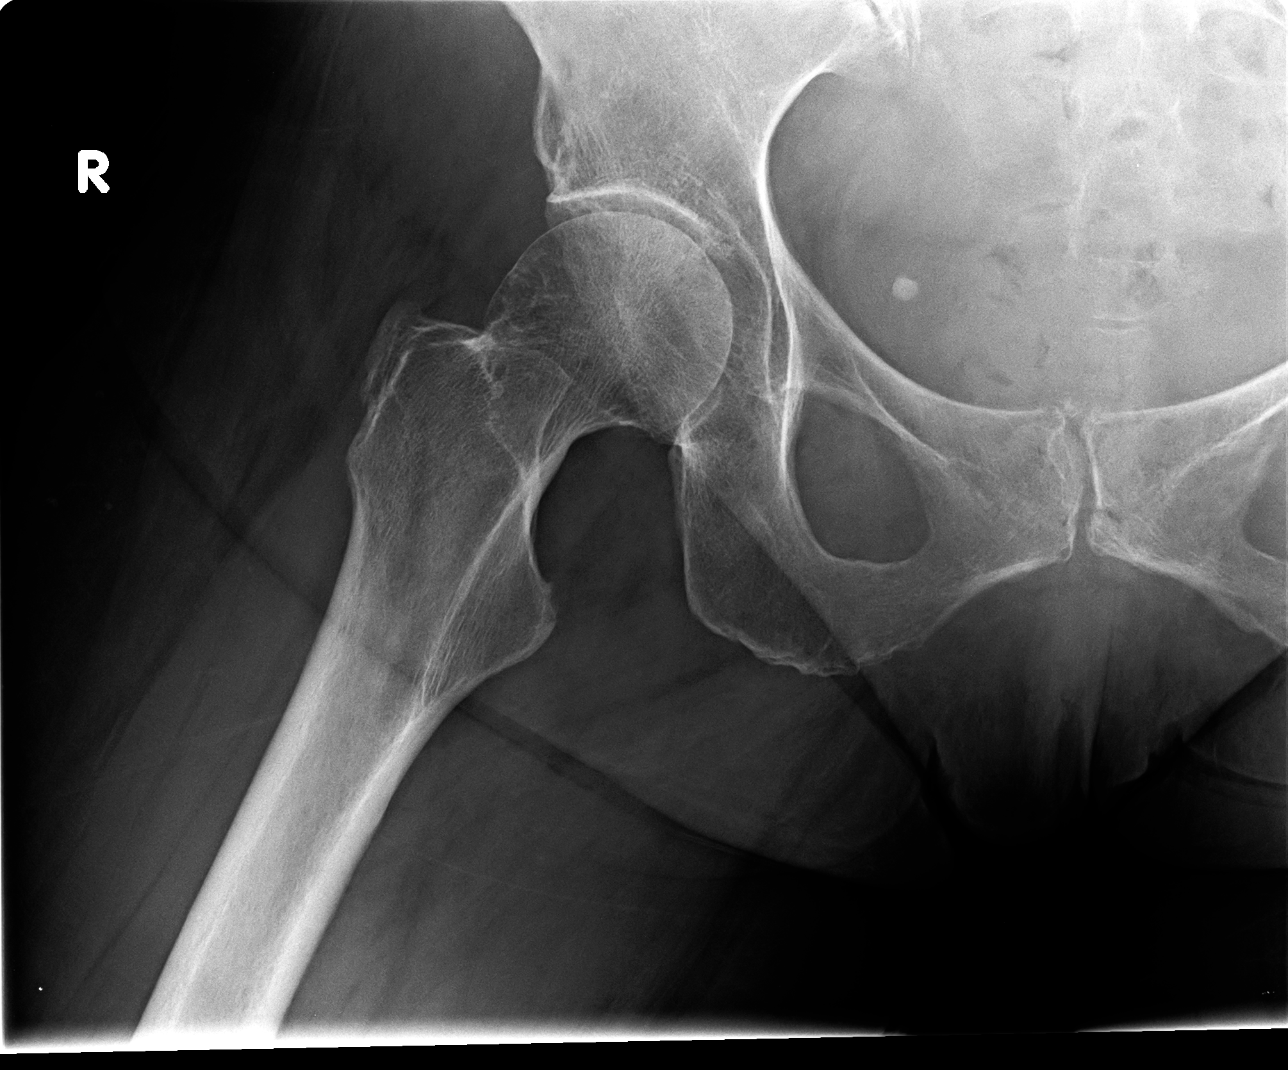

[3 of 3 positions shown; findings below may reference images not displayed]

DIAGNOSTIC STUDIES

EXAM

Two view right hip and single view pelvis

INDICATION

Right hip pain

TECHNIQUE

Frontal radiographs of the hip and pelvis and frog-leg lateral view right hip

COMPARISONS

Right hip MRI without contrast August 13, 2015

FINDINGS

Lumbosacral degeneration is evident. Sacroiliac joints are patent. There is moderate bilateral hip
osteoarthritis with osteophyte formation. Erosive scalloped appearance of the right femoral head is
re-demonstrated as was discussed on the comparison MRI. No acute displaced fracture or
malalignment.

IMPRESSION

1. No acute displaced fracture or malalignment.

2. Moderate bilateral hip osteoarthritis.

3. Erosive change of the right proximal femur is re-demonstrated and was discussed on comparison
MRI from 2077. Correlate with medical history and laboratory values.

## 2017-09-22 ENCOUNTER — Encounter: Admit: 2017-09-22 | Discharge: 2017-09-22 | Payer: MEDICARE

## 2017-09-22 DIAGNOSIS — Z9889 Other specified postprocedural states: ICD-10-CM

## 2017-09-22 DIAGNOSIS — M5030 Other cervical disc degeneration, unspecified cervical region: Principal | ICD-10-CM

## 2017-09-22 DIAGNOSIS — R69 Illness, unspecified: Principal | ICD-10-CM

## 2017-09-22 DIAGNOSIS — I10 Essential (primary) hypertension: Principal | ICD-10-CM

## 2017-09-23 ENCOUNTER — Ambulatory Visit: Admit: 2017-09-22 | Discharge: 2017-09-23 | Payer: MEDICARE

## 2017-10-02 ENCOUNTER — Encounter: Admit: 2017-10-02 | Discharge: 2017-10-02 | Payer: MEDICARE

## 2017-10-04 MED ORDER — GABAPENTIN 300 MG PO CAP
ORAL_CAPSULE | Freq: Every evening | 3 refills | Status: AC
Start: 2017-10-04 — End: ?

## 2018-07-27 IMAGING — MG MAMMOGRAM, DIGITAL SCREEN BILA
1 series · 5 of 5 positions shown · non-contrast
Comparison: none

[Series 2: R CC · right · 5 of 5 slices shown]
[im 1/5]
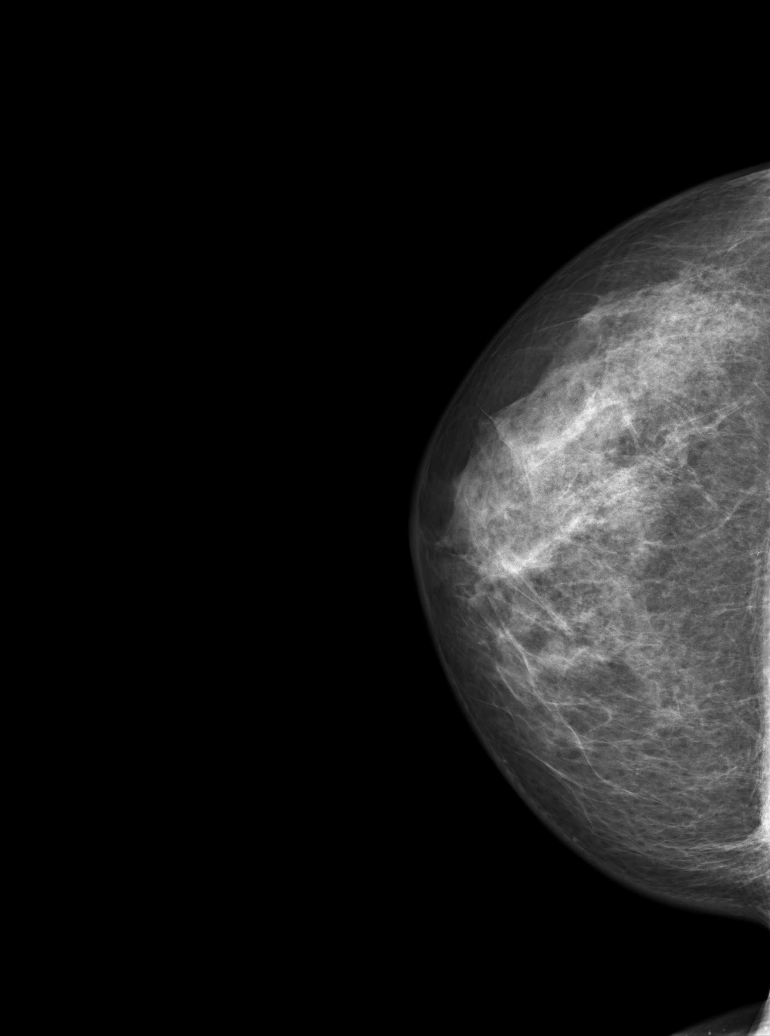
[im 2/5]
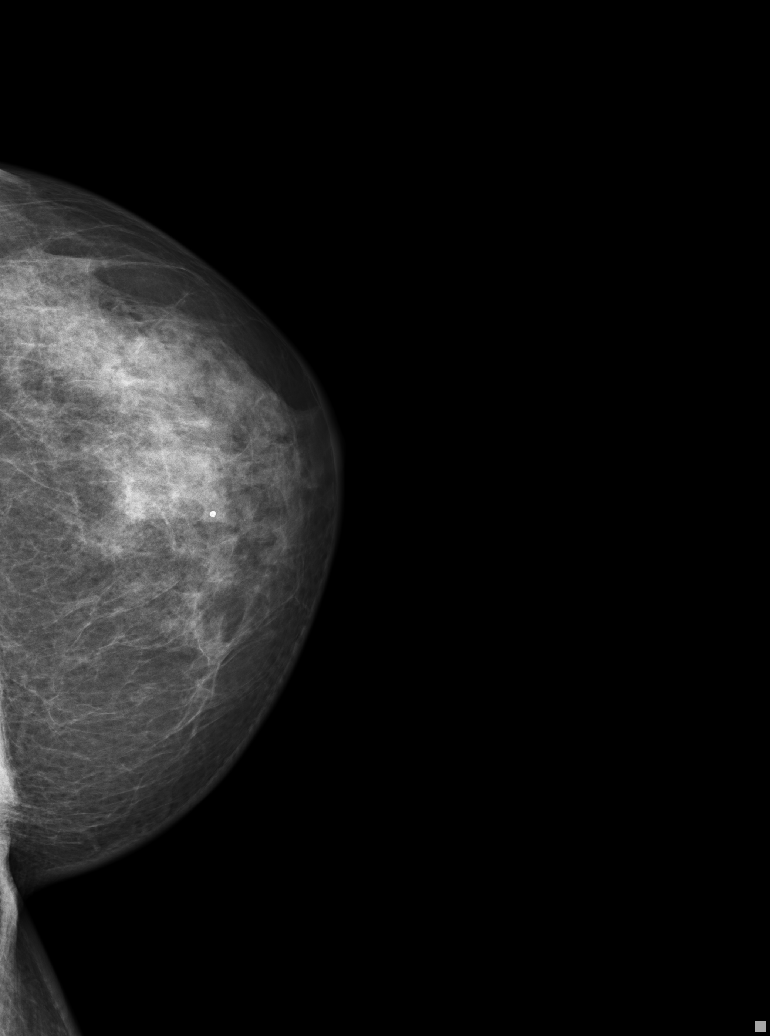
[im 3/5]
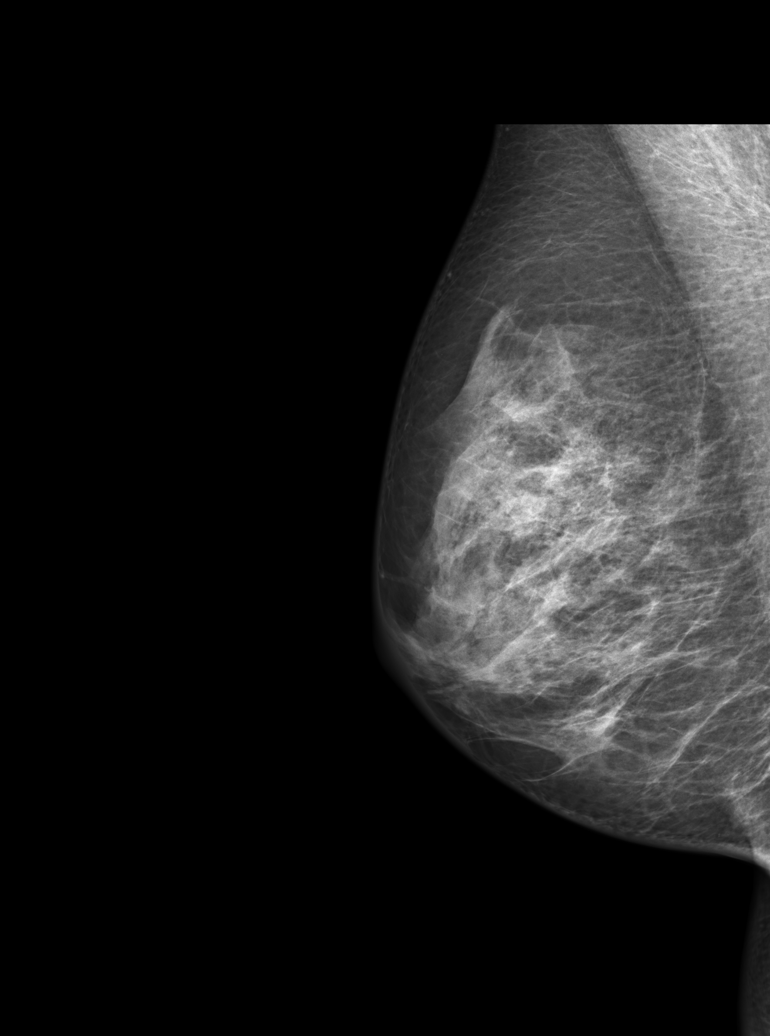
[im 4/5]
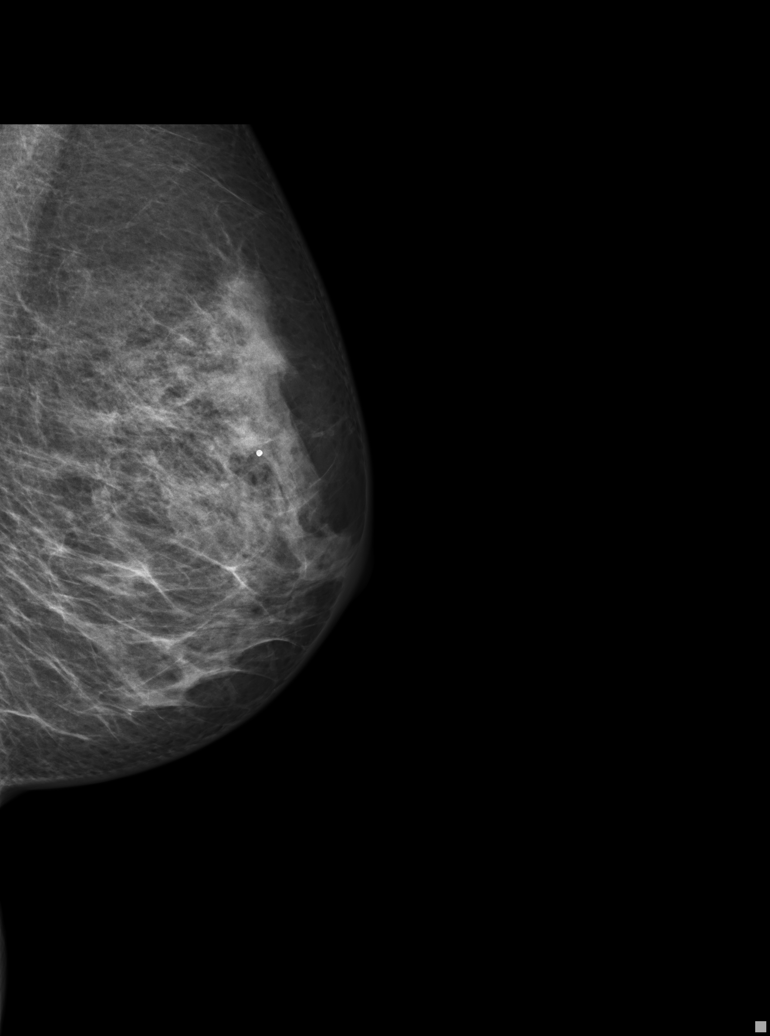
[im 5/5]
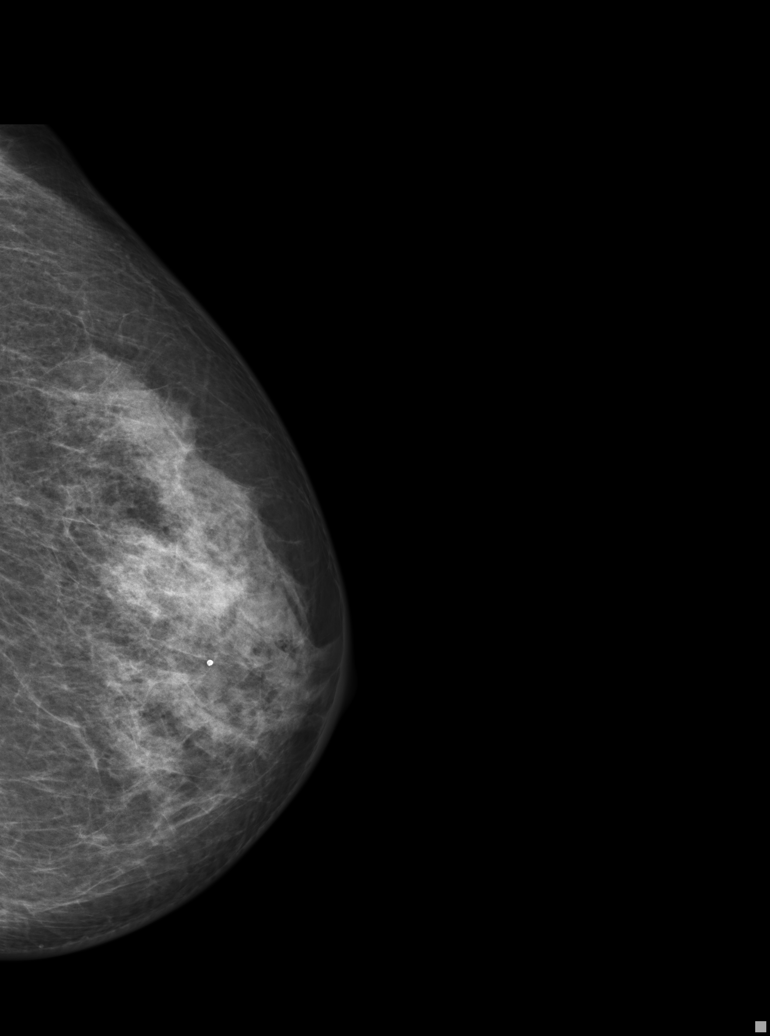

[5 of 5 positions shown; findings below may reference images not displayed]

EXAM
SCREENING MAMMOGRAM, BILATERAL

INDICATION
screening
PT HAD NECK FUSION November 2017, VERY LIMITED RANGE PREVENTING 3D. SCREENING. AB (2D) PRIORS: 2723,

TECHNIQUE
Bilateral craniocaudal and medial lateral oblique views were generated and reviewed with computer-
aided detection.

COMPARISONS
February 12, 2017.

FINDINGS
ACR Type 2: 25-50% There are scattered fibroglandular densities.
No concerning masses or calcifications are seen.

IMPRESSION
Stable bilateral mammography. One year follow-up is recommended.
BI-RADS 2, BENIGN.

Tech Notes:

## 2019-08-01 IMAGING — MG MAMMOGRAM, DIGITAL SCREEN BILA
1 series · 5 of 5 positions shown · non-contrast
Comparison: none

[Series 2: R CC · right · 5 of 5 slices shown]
[im 1/5]
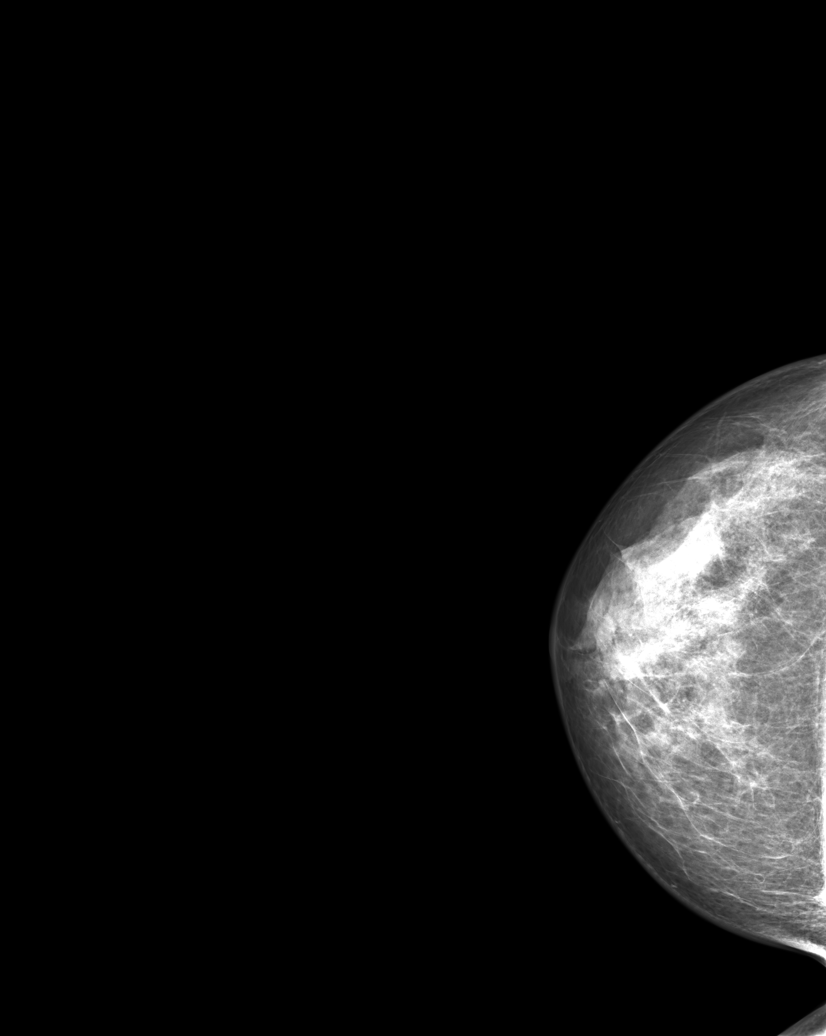
[im 2/5]
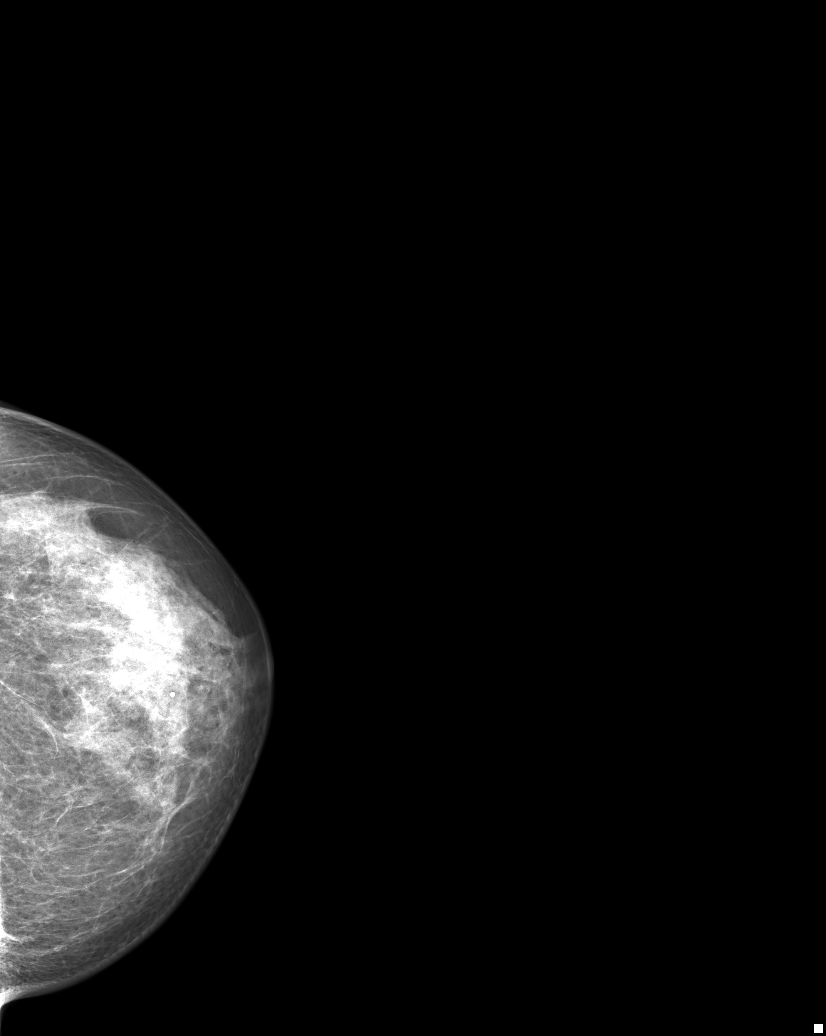
[im 3/5]
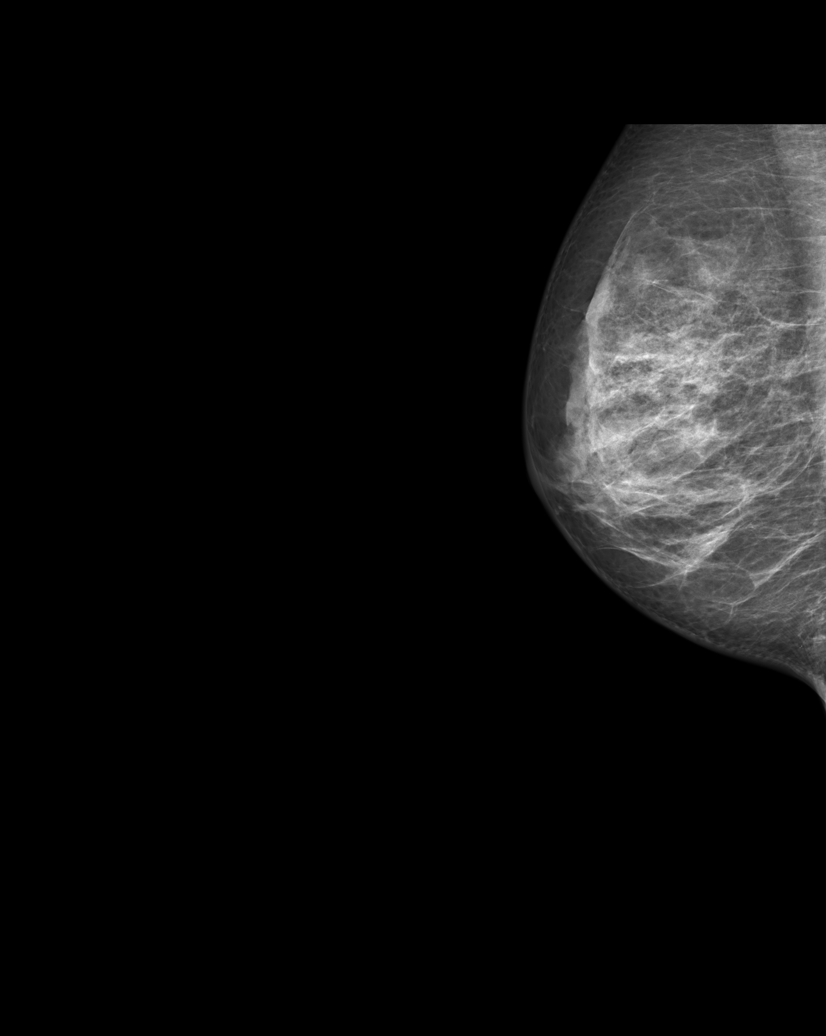
[im 4/5]
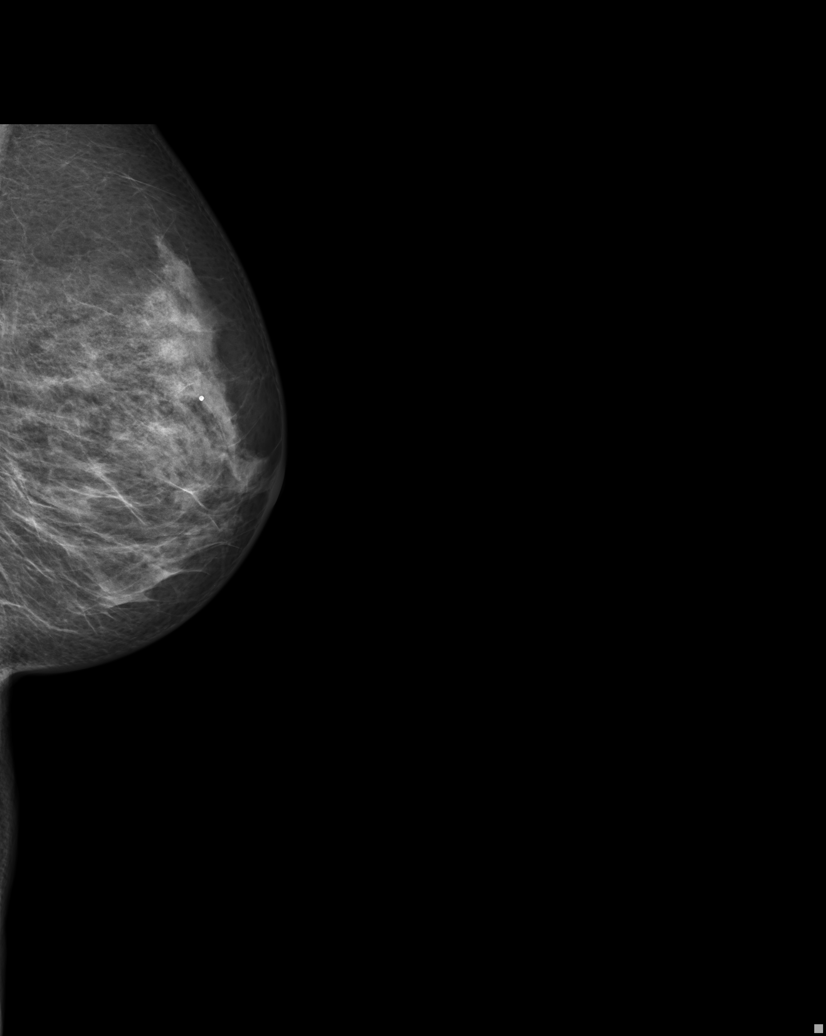
[im 5/5]
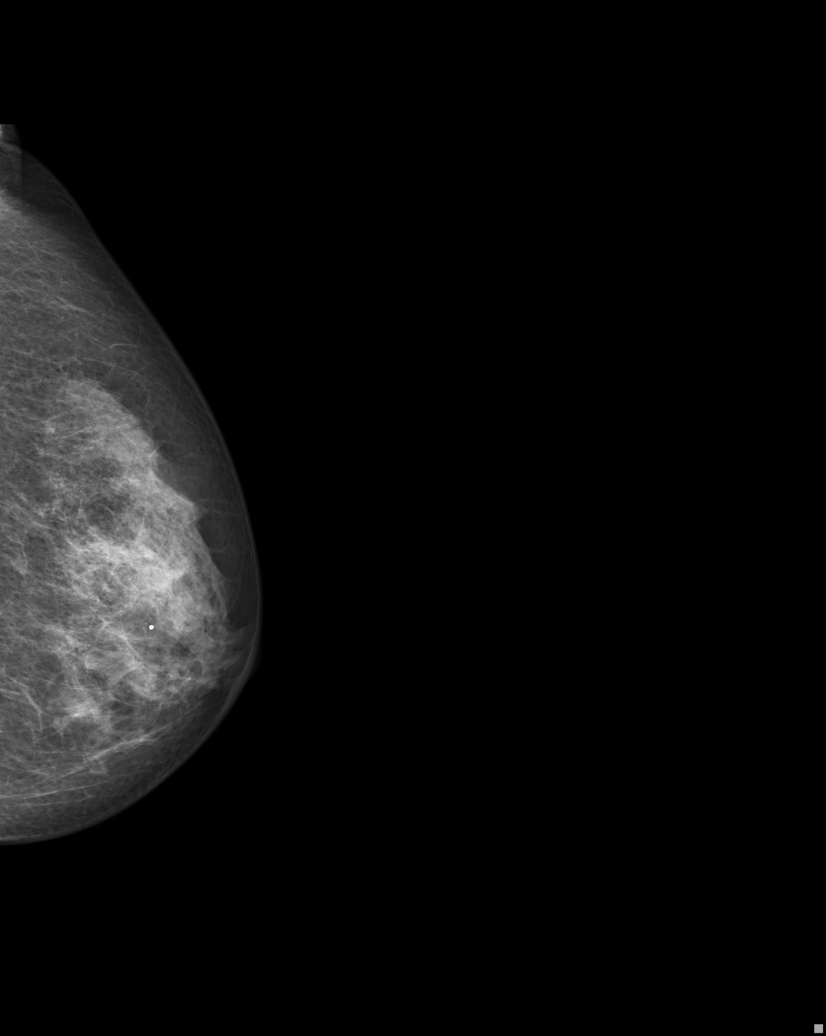

[5 of 5 positions shown; findings below may reference images not displayed]

EXAM

BILATERAL DIGITAL SCREENING MAMMOGRAM, CPT CYFYF; WITH CAD

INDICATION

screening
NULLIPARITY.  SCREENING. AB ([id number] NECK ROM- FUSION 7289)  PRIORS: 7289, 5103.

TECHNIQUE

Bilateral digital CC and MLO views of the breasts were generated and reviewed with CAD.

COMPARISONS

Previous examination dated 07/27/2018.

FINDINGS

The breasts are heterogeneously dense. This limits the sensitivity of mammography. There are no
suspicious clusters of micro calcifications. There are no suspicious spiculated masses. There is no
evidence of skin thickening and retraction. There is no evidence of axillary lymphadenopathy. Benign
calcifications are identified.

IMPRESSION

No localizing signs of malignancy.

BI-RADS 2, BENIGN.

A reminder letter will be sent.

A twelve month screening mammogram is recommended and a follow-up letter will be scheduled.

Computer Aided Detection was utilized for this interpretation.

A. A negative x-ray report should not delay biopsy if a dominant or clinically suspicious mass is
present.

B. A small percentage (probably less than 10%) of cancers will not be identified by x-ray, and
there will be a similar small percentage of false positive reports.

C. Adenosis and dense breasts may obscure an underlying neoplasm.

Tech Notes:

## 2020-05-08 IMAGING — CT SINUS_(Adult)
3 of 4 series · 10 of 47 positions shown, 11 images · non-contrast
Comparison: none

[Series 2: sinus ax 3.00 hr60 s3 · axial · 0.34mm/px · z∈[-538,-478]mm · 4 of 29 slices shown, 5 images]
[im 5/29  brain]
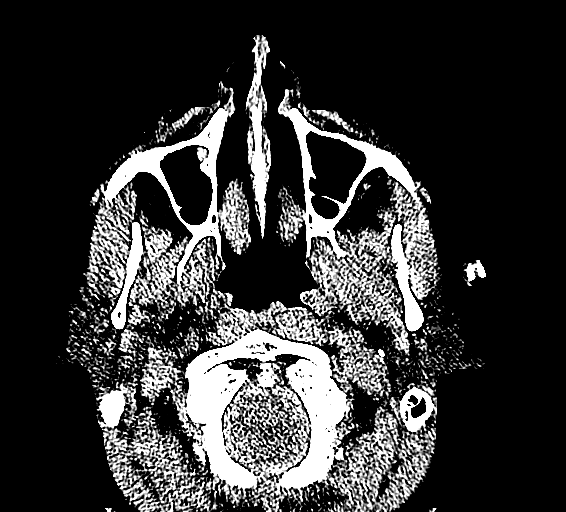
[im 5/29  bone]
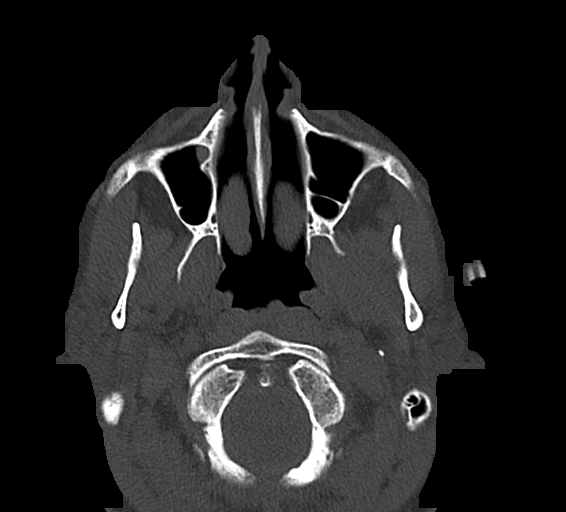
[im 11/29  bone]
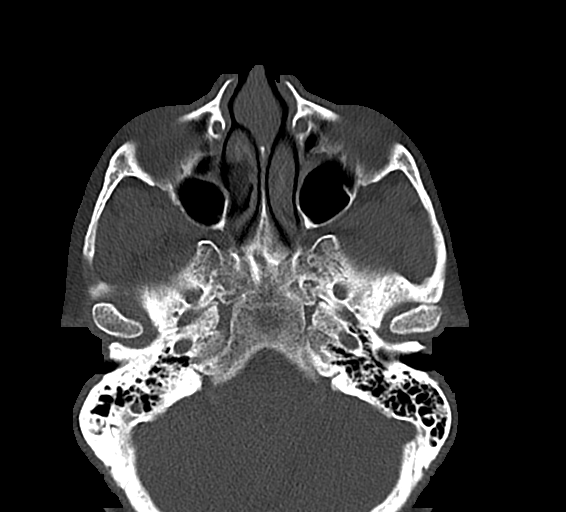
[im 19/29  bone]
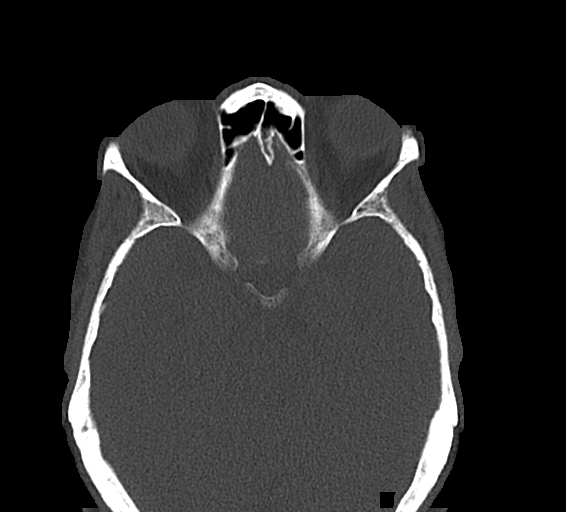
[im 25/29  bone]
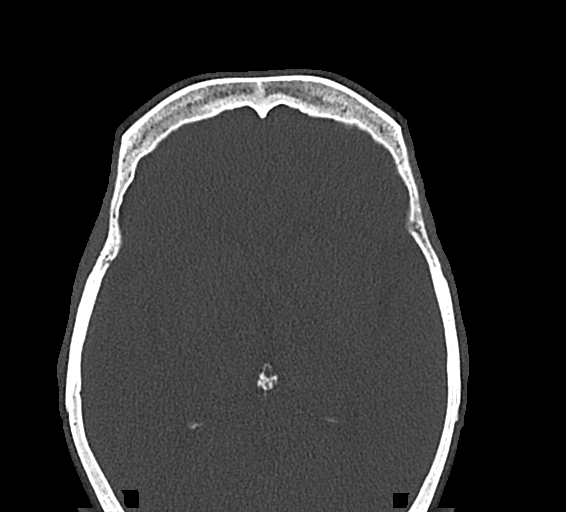

[Series 4: sinus cor 3.00 hr60 s3 · coronal · 0.17mm/px · 3 of 58 slices shown]
[im 20/58  bone]
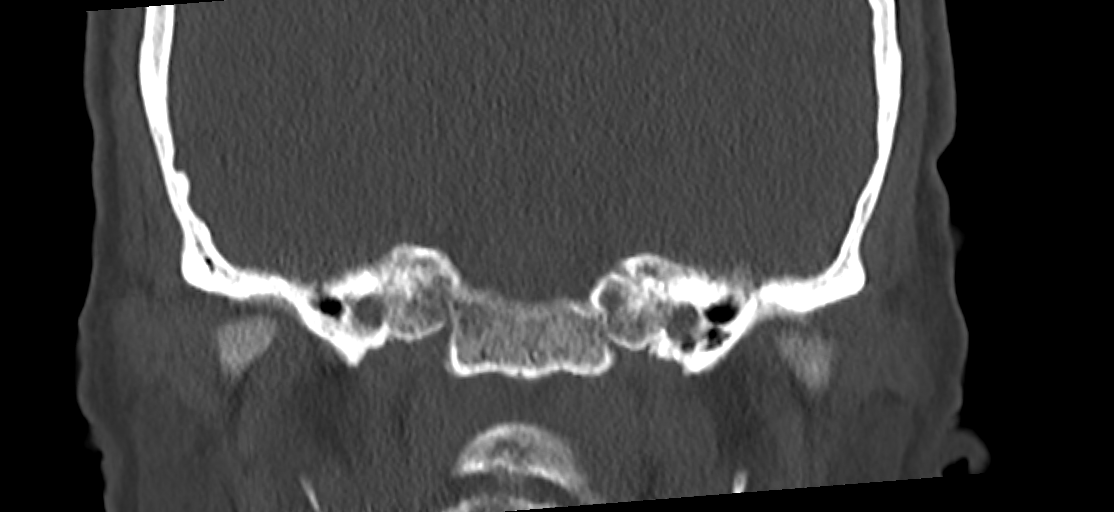
[im 26/58  bone]
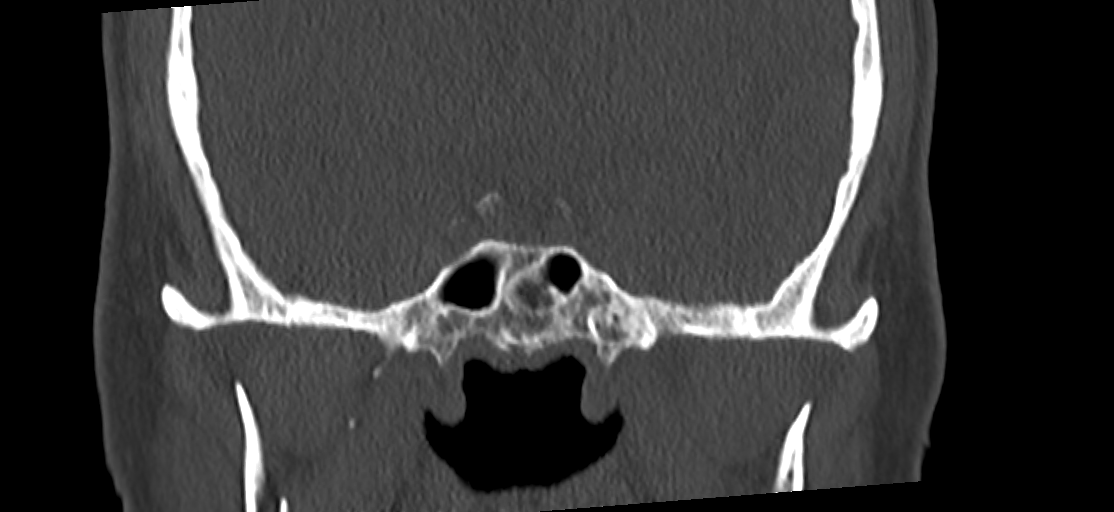
[im 32/58  bone]
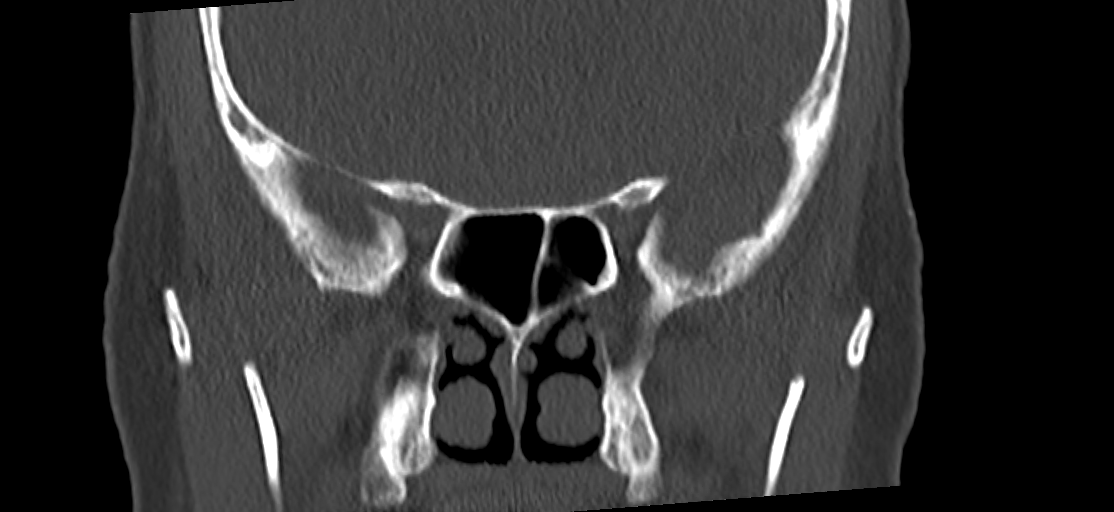

[Series 6: sinus sag 3.00 hr60 s3 · sagittal · 0.17mm/px · 3 of 64 slices shown]
[im 22/64  bone]
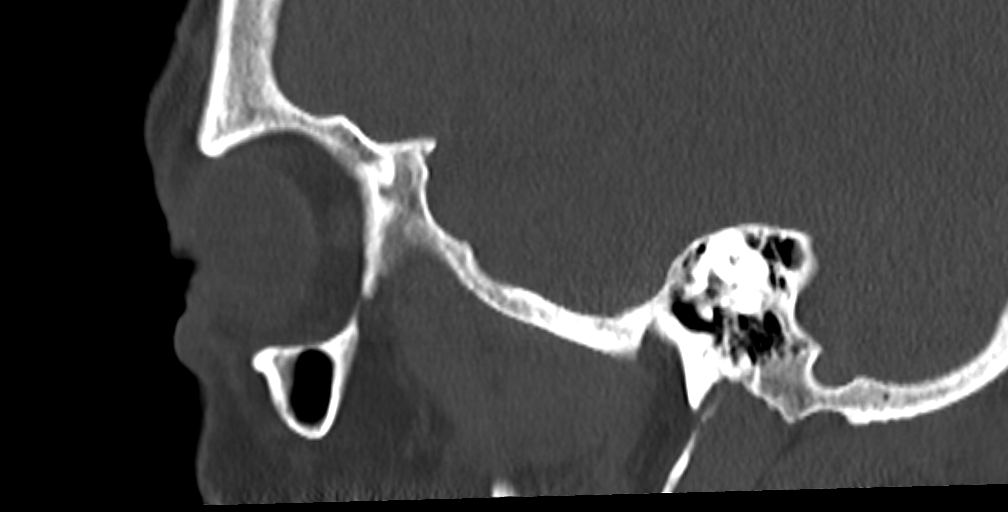
[im 32/64  bone]
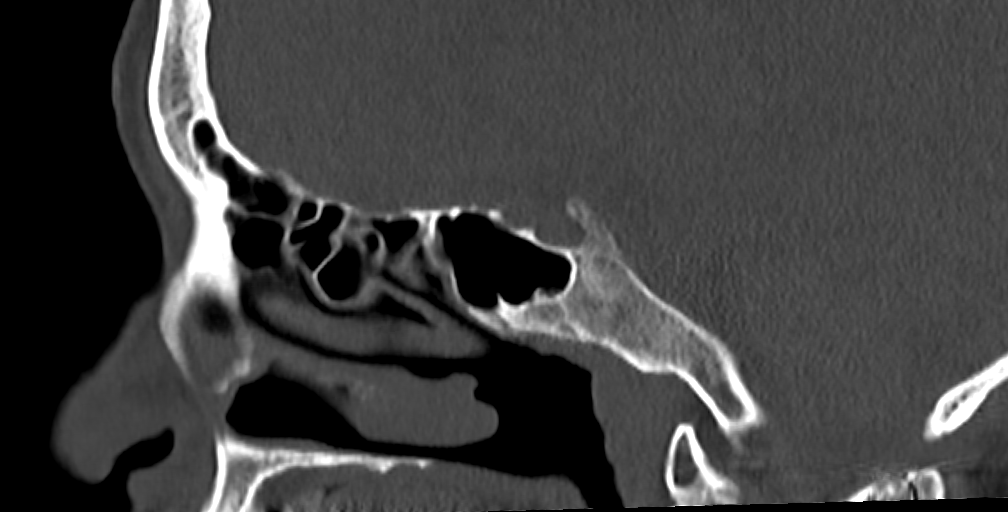
[im 43/64  bone]
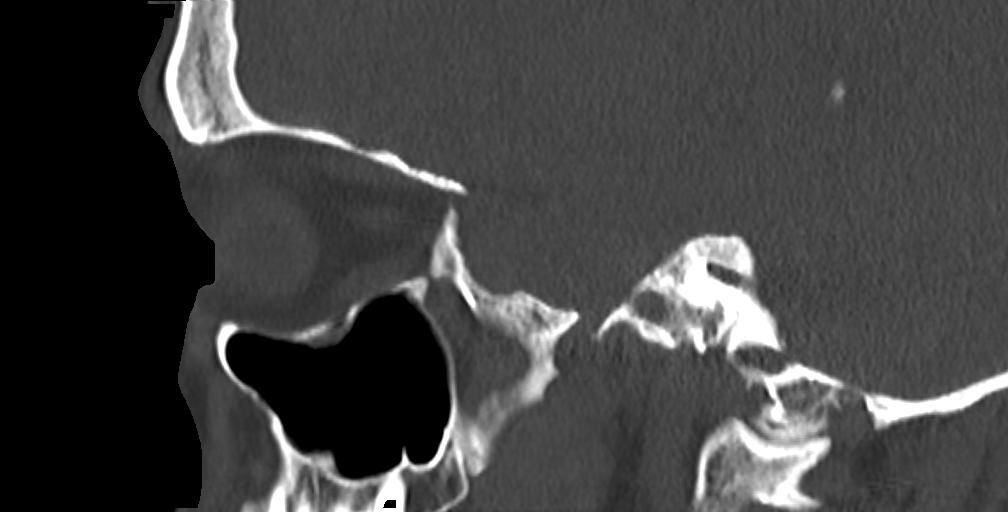

[10 of 47 positions shown; findings below may reference images not displayed]

EXAM

CT paranasal sinuses.

INDICATION

nasal injury May; eyes watering since
PT STATES HAD A FALL [REDACTED] AND HIT NOSE. PT C/O INCREASE IN EYES WATERING. HX OF BILATERAL
CATARACT SURGERY AND STENTS PLACED. CT/NM 0/0. TJ

FINDINGS

CT of the paranasal sinuses was performed without contrast.

All CT scans at this facility use dose modulation, iterative reconstruction, and/or weight based
dosing when appropriate to reduce radiation dose to as low as reasonably achievable. In the past 12
months, there have been 0 CT scans and 0 nuclear medicine myocardial perfusion scans.

Other as mild mucosal thickening within the ethmoid and maxillary sinuses.  The sphenoid sinus is
clear.  The frontal sinuses are clear.

The nasal septum and nasal bones are entire.  The orbital walls appear unremarkable.

There has been smoothly marginated soft tissue fullness along the inferior margin of the nasal
septum anteriorly measuring up to 14 millimeters transversely.

IMPRESSION

There is no acute facial bone lesion.

There is chronic ethmoid sinusitis.  There is minimal chronic maxillary sinusitis.

There is a smoothly marginated soft tissue fullness of the inferior and anterior margin of the
nasal septum, probably of no clinical significance.

Tech Notes:

PT STATES HAD A FALL [REDACTED] AND HIT NOSE. PT C/O INCREASE IN EYES WATERING. HX OF BILATERAL CATARACT
SURGERY AND STENTS PLACED. CT/NM 0/0. TJ

## 2020-11-28 IMAGING — CT SINUS_(Adult)
3 of 4 series · 10 of 47 positions shown, 11 images · non-contrast
Comparison: none

[Series 2: sinus ax 3.00 hr60 s3 · axial · 0.34mm/px · z∈[-566,-503]mm · 4 of 31 slices shown, 5 images]
[im 5/31  brain]
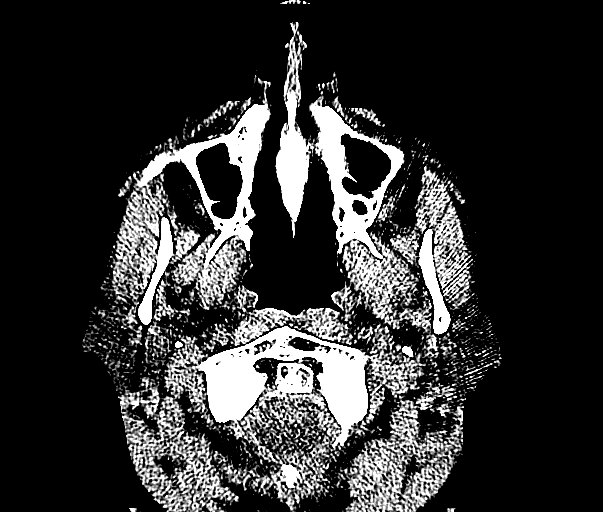
[im 5/31  bone]
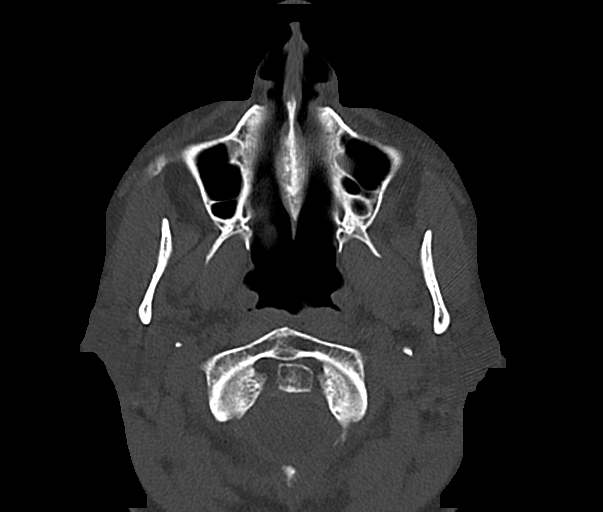
[im 11/31  bone]
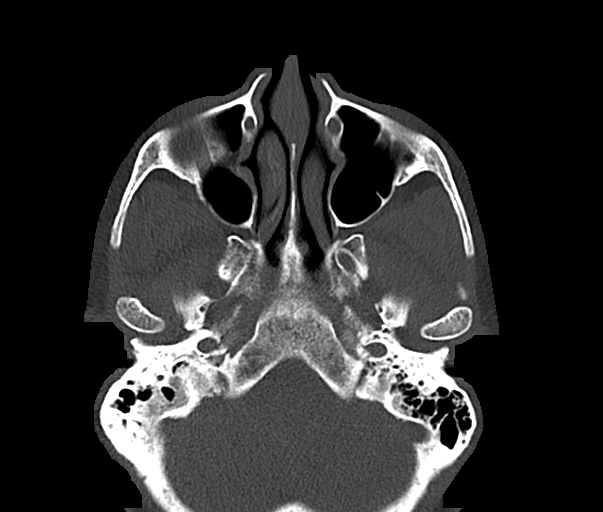
[im 20/31  bone]
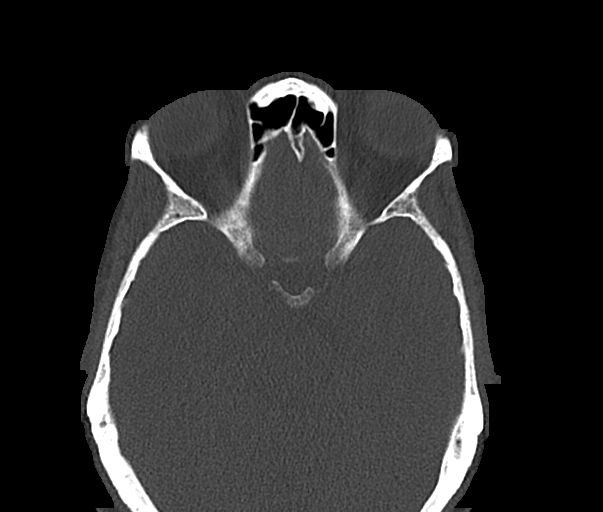
[im 26/31  bone]
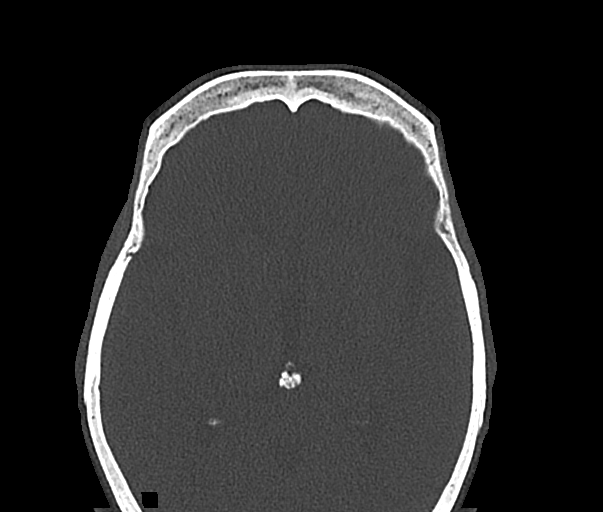

[Series 4: sinus cor 3.00 hr60 s3 · coronal · 0.18mm/px · 3 of 57 slices shown]
[im 19/57  bone]
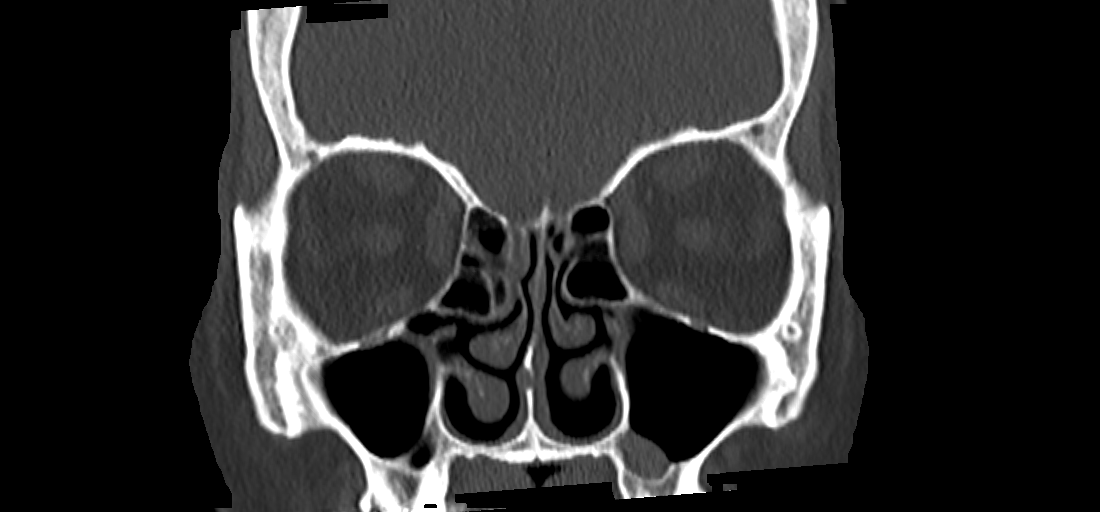
[im 25/57  bone]
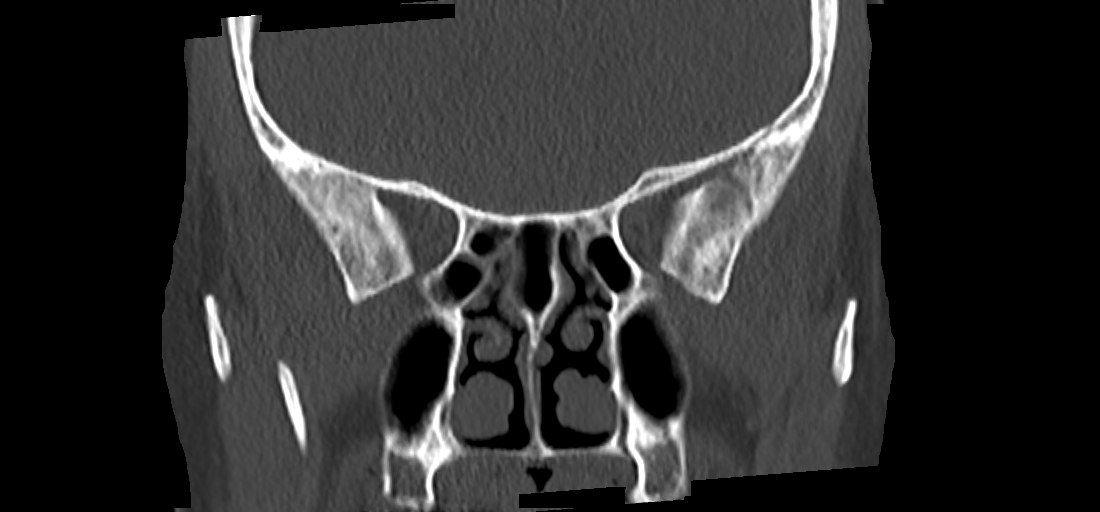
[im 32/57  bone]
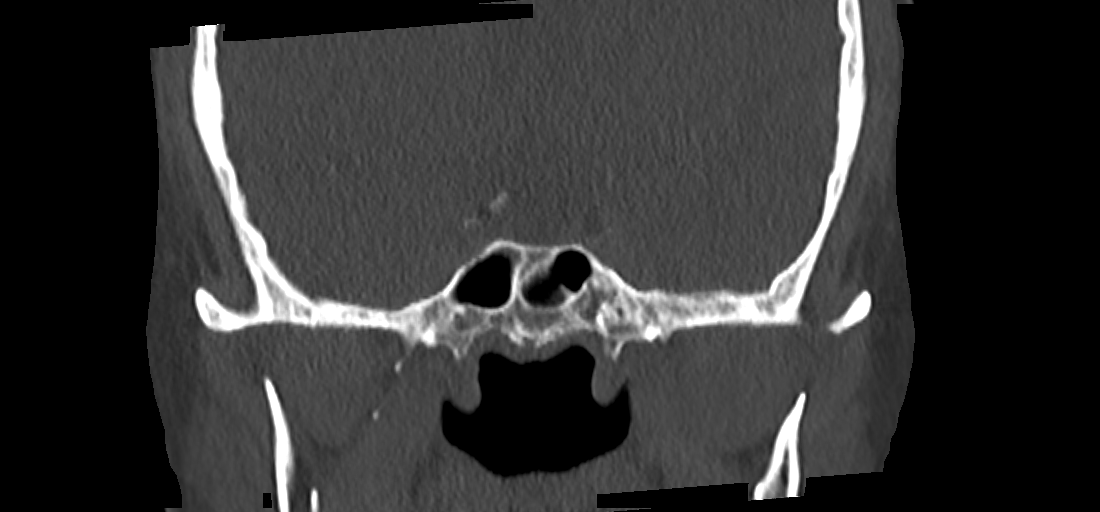

[Series 6: sinus sag 3.00 hr60 s3 · sagittal · 0.18mm/px · 3 of 67 slices shown]
[im 23/67  bone]
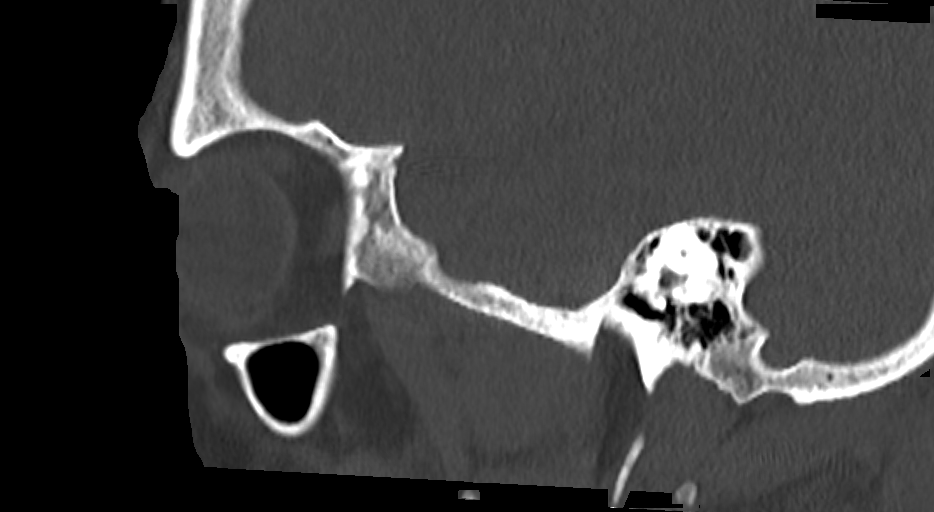
[im 34/67  bone]
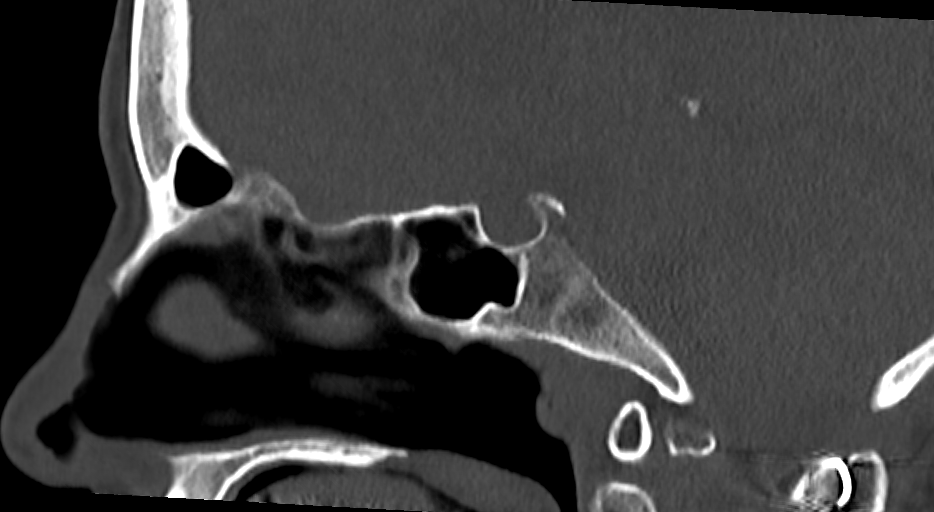
[im 45/67  bone]
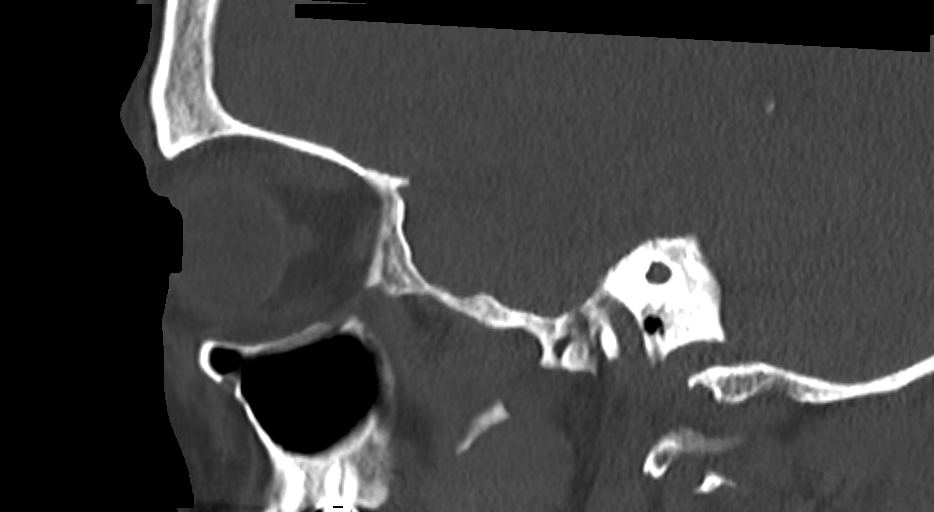

[10 of 47 positions shown; findings below may reference images not displayed]

EXAM

Sinus CT.

INDICATION

chronic right sinus pressure and right eye waterin
1 ct/0 nm. Chronic right sinus pressure and right eye watering. tj

TECHNIQUE

Noncontrast CT scan of the paranasal sinuses with coronal and sagittal reformations. 1 prior CT
scan and no myocardial profusion scans in the past year. All CT scans at this facility use dose
modulation, iterative reconstruction, and/or weight based dosing when appropriate to reduce
radiation dose to as low as reasonably achievable.

COMPARISONS

May 08, 2020

FINDINGS

There are small mucous retention cysts in the inferior maxillary sinuses. No layering fluid. The
ostiomeatal units are patent. The ethmoid air cells show scattered areas of mucosal thickening and
opacification but there is no layering fluid. The sphenoid sinuses are clear. The sphenoethmoidal
recesses are patent. The frontal sinuses are clear. The frontoethmoidal recesses are patent.

The nasal septum is midline.

Surgical change of the posterior inferior occipital bone is stable.

The orbits are symmetric. There is no postseptal or retrobulbar edema or hemorrhage. Cataract
surgeries incidentally noted.

There is no intra-axial or extra-axial hemorrhage. There is evidence of mild volume loss but there
is no hydrocephalus.

The mastoid air cells are clear. The external auditory canals are patent. There is no thickening of
the tympanic membranes. The auditory ossicles are normal. No fluid or soft tissue attenuation in the
middle years. The internal auditory canals are symmetric and are normal. The cochlear and
semicircular canals are likewise within normal limits.

The temporomandibular joints are well aligned.

IMPRESSION

Minimal paranasal sinus disease. No evidence of obstructive etiology.

Tech Notes:

1 ct/0 nm. Chronic right sinus pressure and right eye watering. tj

## 2021-01-30 IMAGING — MG MAMMOGRAM, DIGITAL SCREEN BILA
1 series · 6 of 6 positions shown · non-contrast
Comparison: none

[Series 2: R CC · right · 6 of 6 slices shown]
[im 1/6]
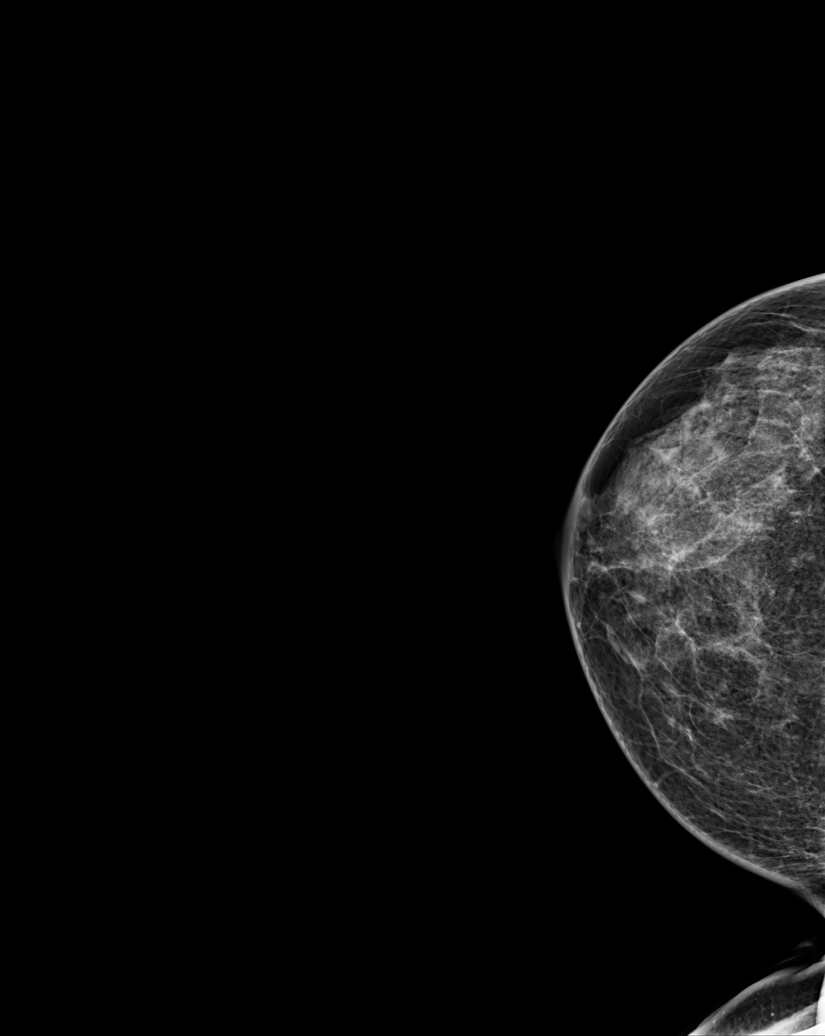
[im 2/6]
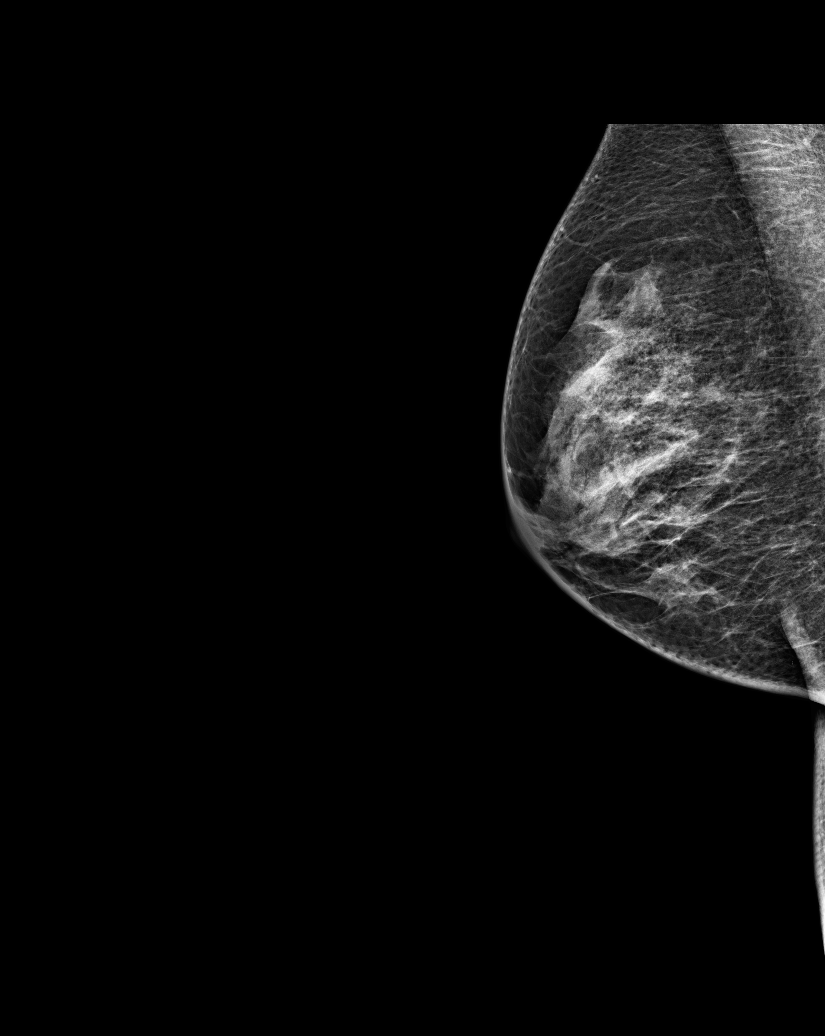
[im 3/6]
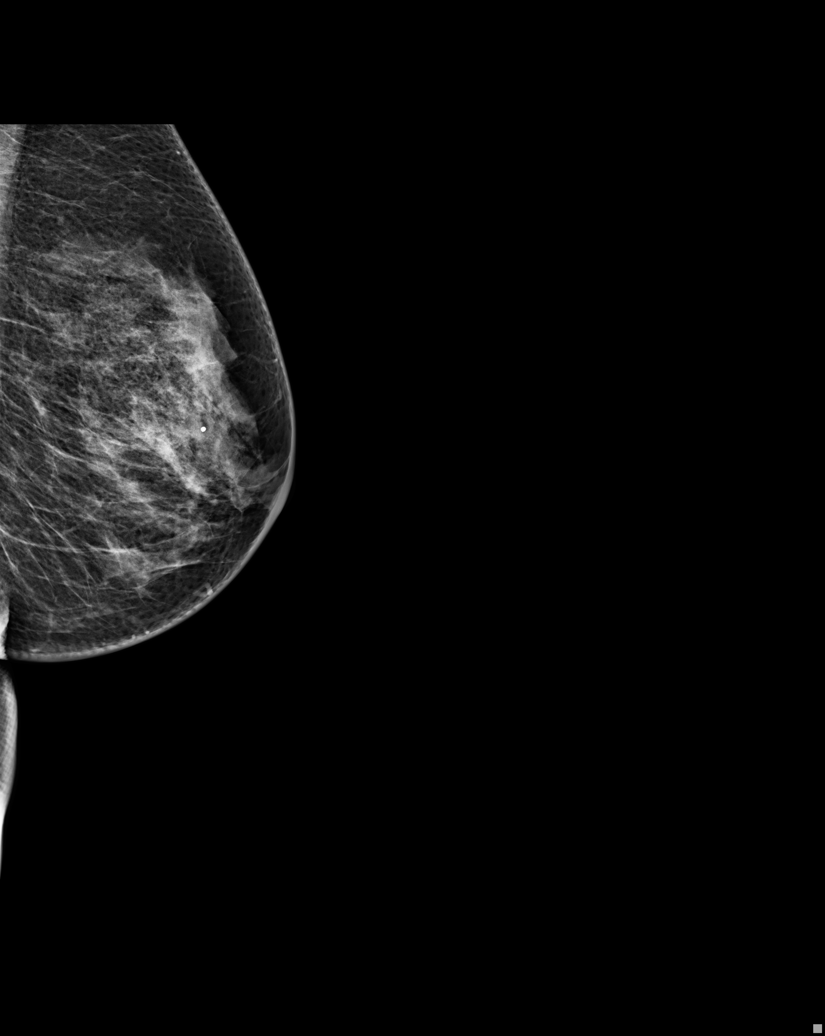
[im 4/6]
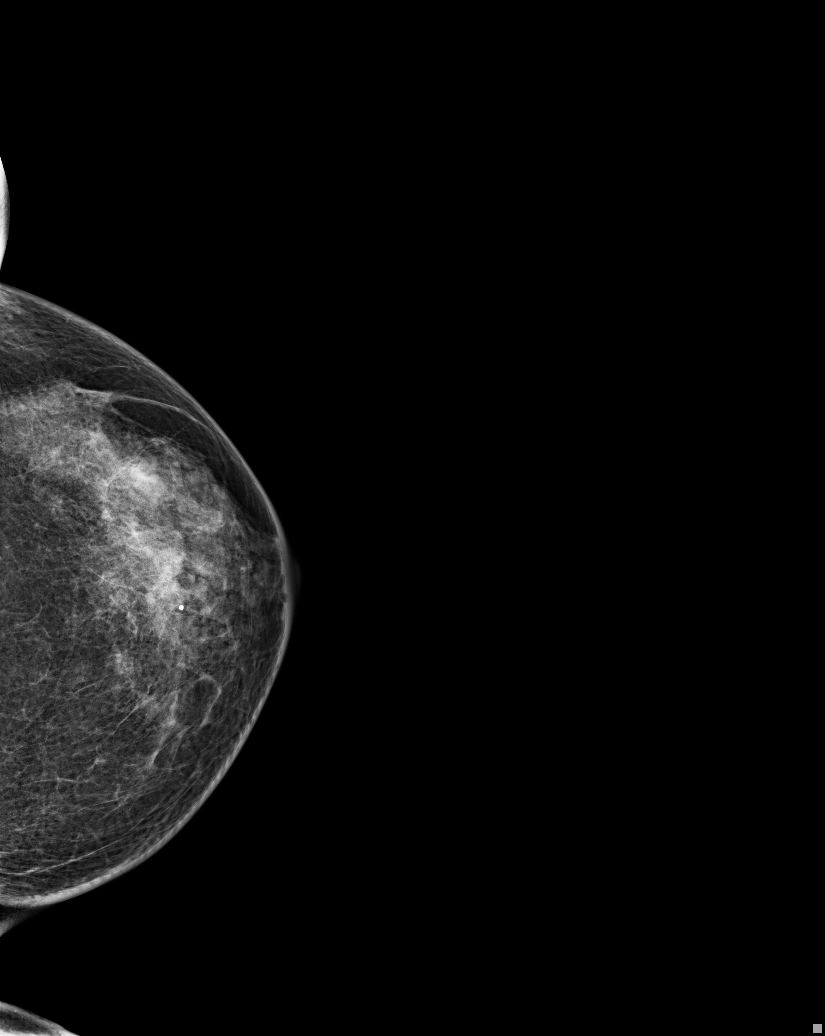
[im 5/6]
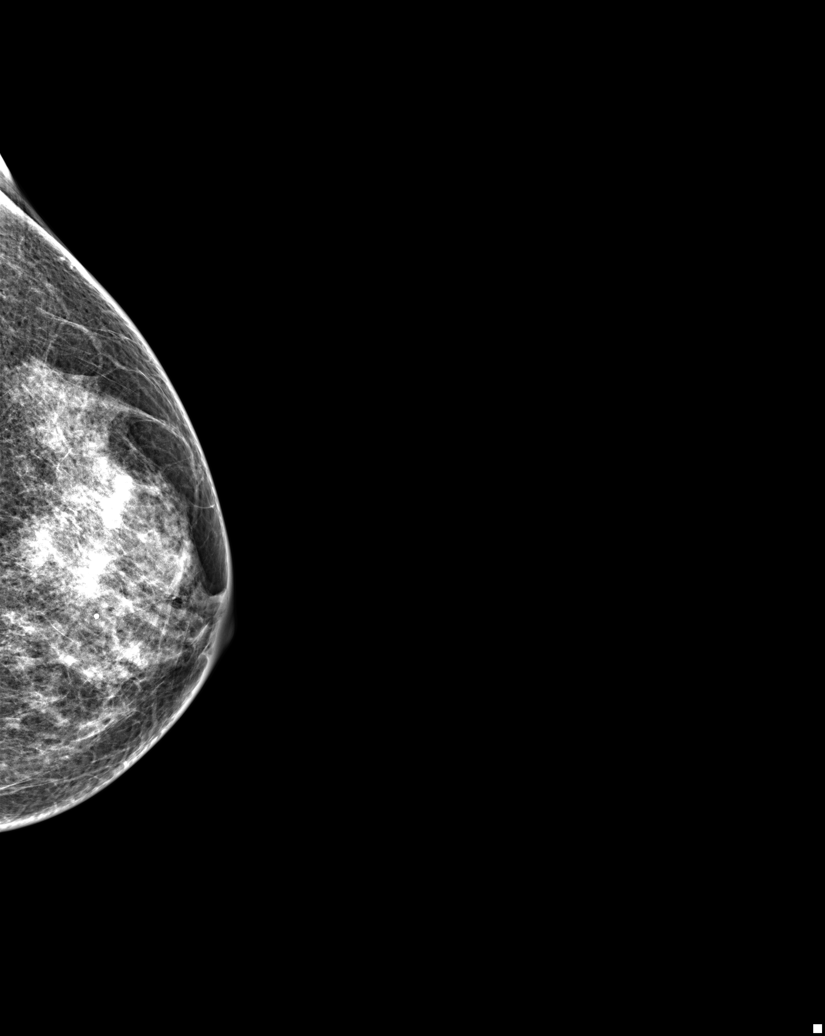
[im 6/6]
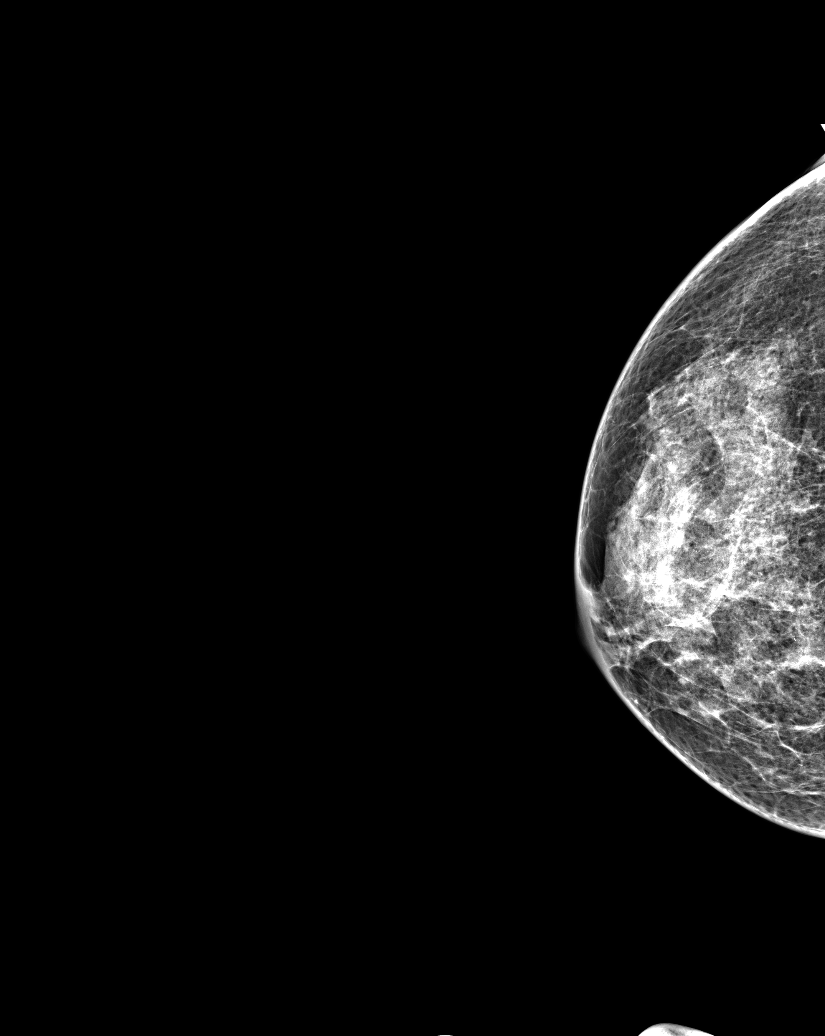

[6 of 6 positions shown; findings below may reference images not displayed]

EXAM

SCREENING MAMMOGRAM, BILATERAL

INDICATION

screening
screening. KF 2D. priors 4040

TECHNIQUE

Bilateral craniocaudal and medial lateral oblique views were generated and reviewed with computer-
aided detection.

COMPARISONS

August 01, 2019

FINDINGS

ACR Type 2:  25-50% There are scattered fibroglandular densities.

No concerning masses or calcifications are seen. The breasts are stable and unchanged.

IMPRESSION

Stable mammograms. One year follow-up is recommended.

BI-RADS 2, BENIGN.

Tech Notes:

## 2021-02-03 IMAGING — CT BRAIN WO(Adult)
3 of 4 series · 14 of 47 positions shown, 16 images · non-contrast
Comparison: No relevant prior studies available.

DIAGNOSTIC STUDIES

EXAM:  CT HEAD WITHOUT INTRAVENOUS CONTRAST  (43223)
INDICATION: possible seizure POSSIBLE SEIZURE TODAY. LOC WITH TREMMORS. HX. 2 PRIOR SEIZURES IN
HER LIFE, MOST RECENT WAS 12+ YRS AGO. AB CT/NM: 2/0
TECHNIQUE: Axial computed tomography images of the head/brain without intravenous contrast.
Sagittal and coronal reformatted images were created and reviewed.

[Series 4: brain cor 5.00 hr40 s3 · coronal · 0.31mm/px · 3 of 40 slices shown]
[im 14/40  brain]
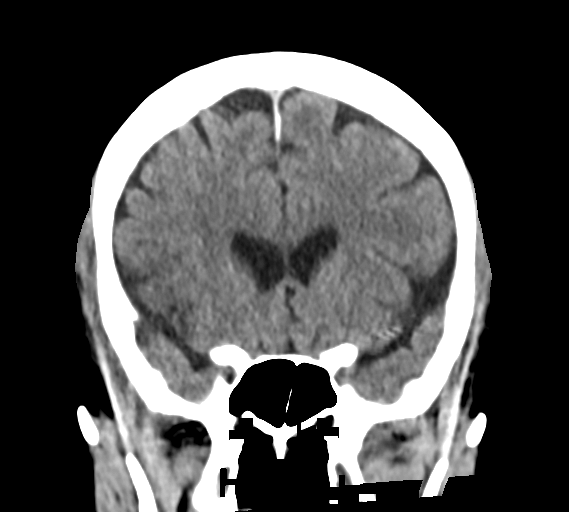
[im 18/40  brain]
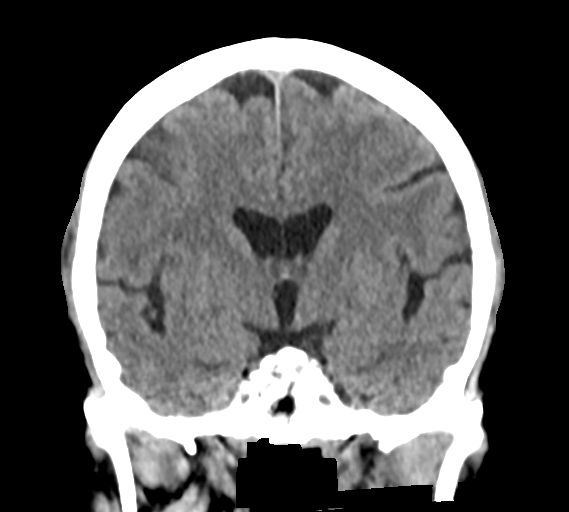
[im 22/40  brain]
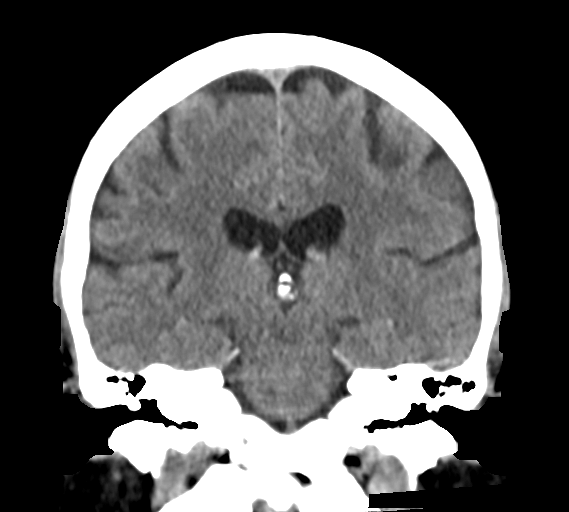

[Series 6: brain sag 5.00 hr40 s3 · sagittal · 0.31mm/px · 3 of 35 slices shown]
[im 12/35  brain]
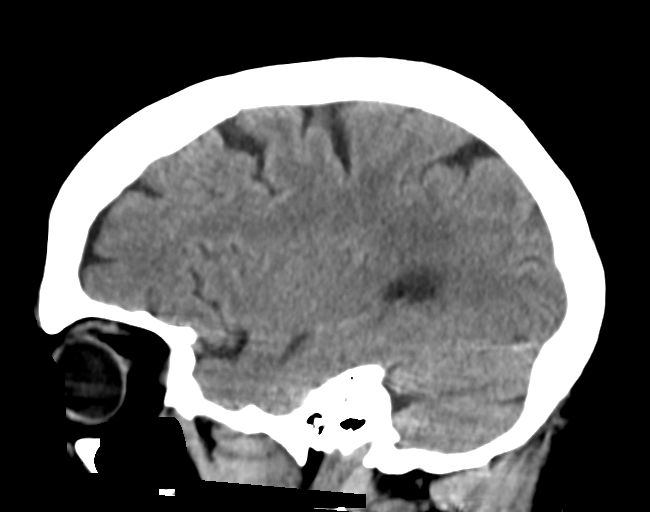
[im 18/35  brain]
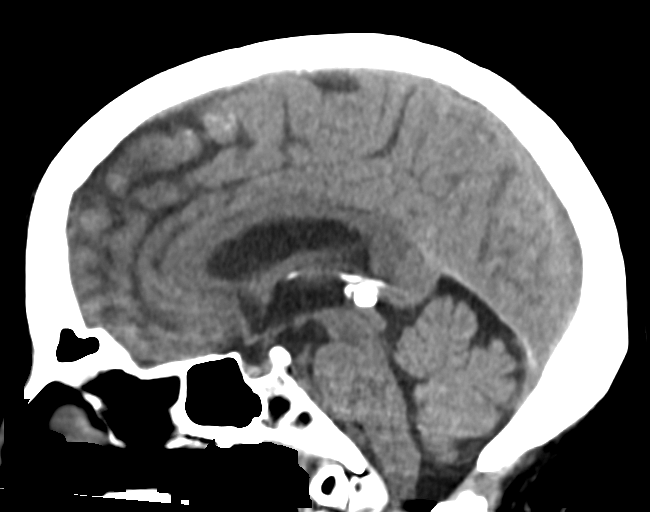
[im 23/35  brain]
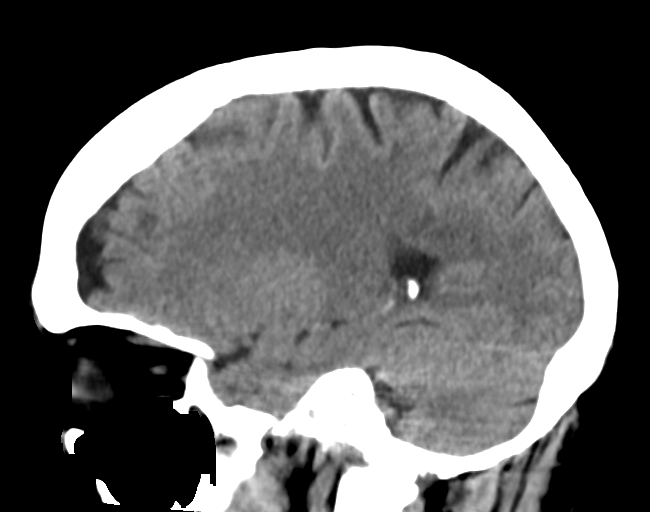

[Series 8: brain ax 2.00 hr60 s3 · axial · 0.34mm/px · z∈[-554,-431]mm · 8 of 78 slices shown, 10 images]
[im 8/78  brain]
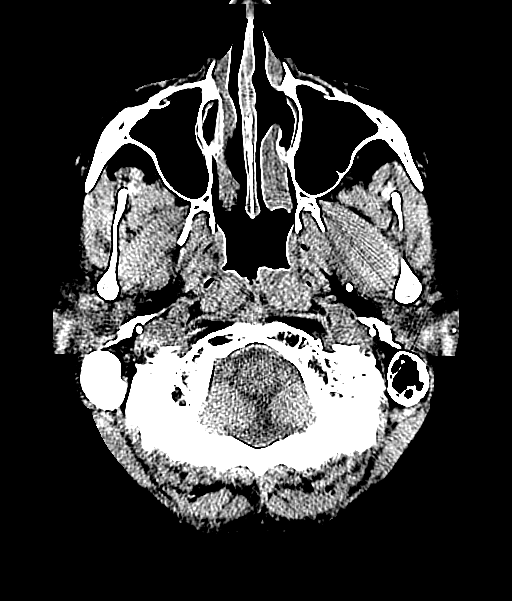
[im 8/78  bone]
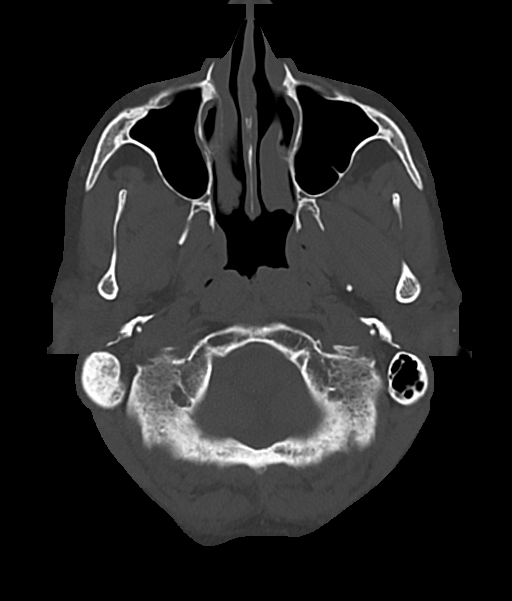
[im 16/78  brain]
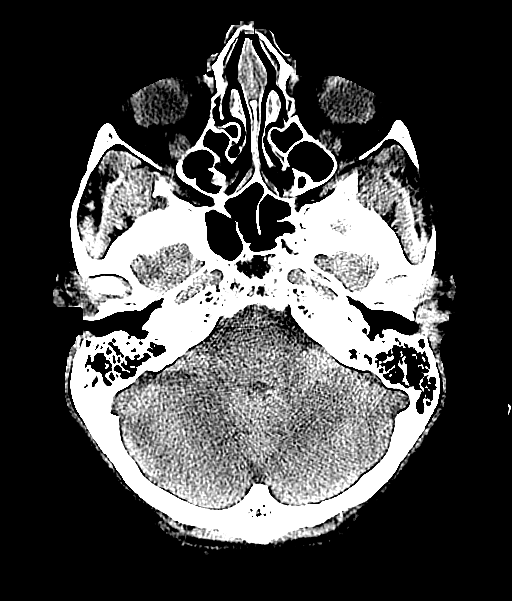
[im 24/78  brain]
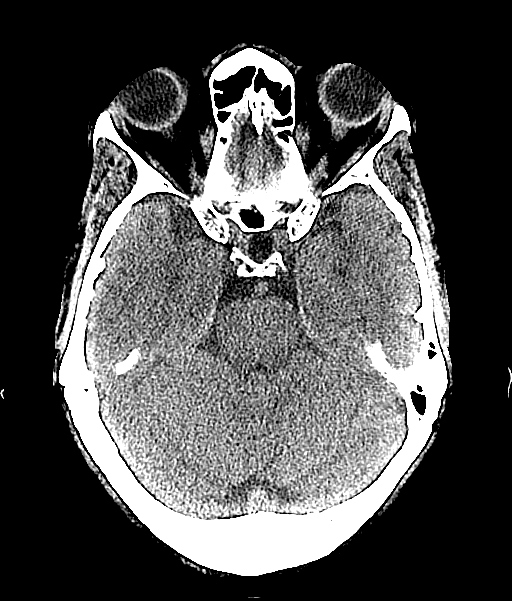
[im 35/78  brain]
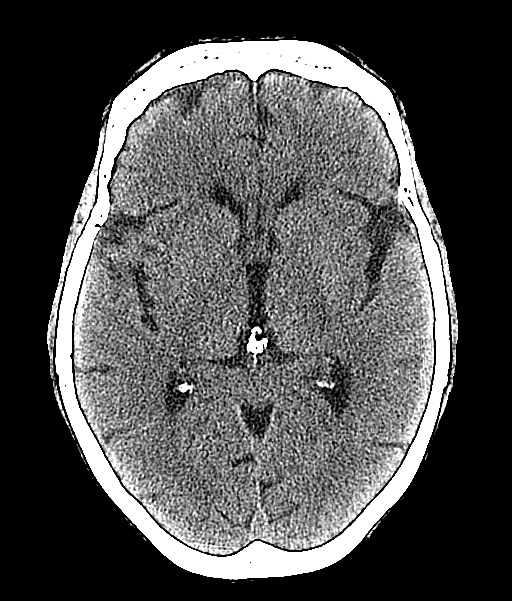
[im 43/78  brain]
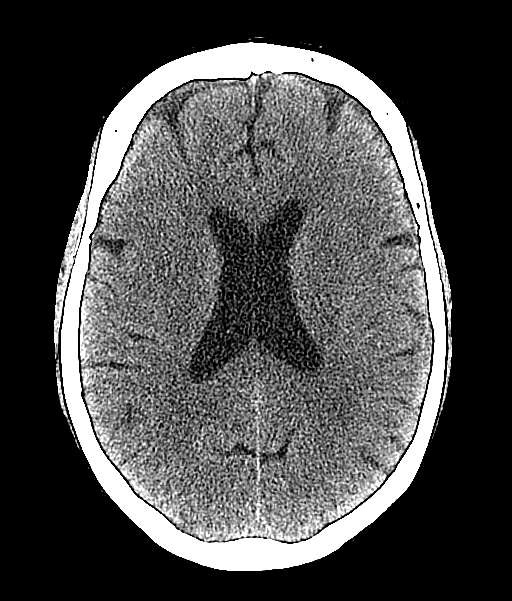
[im 43/78  bone]
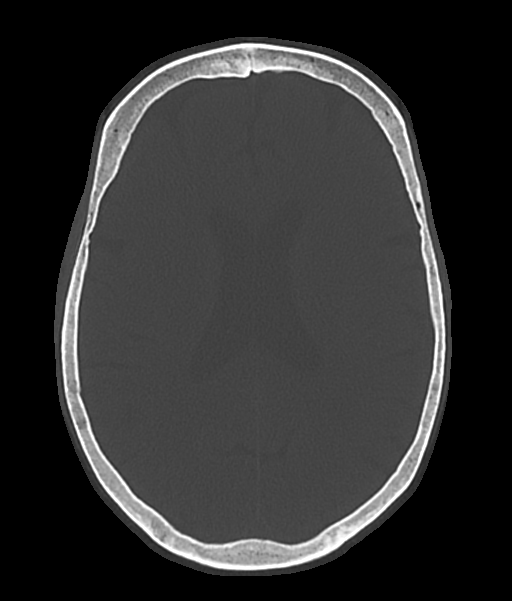
[im 54/78  brain]
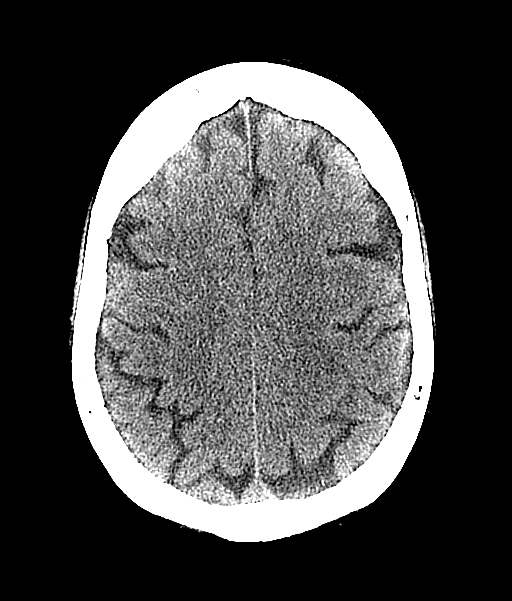
[im 62/78  brain]
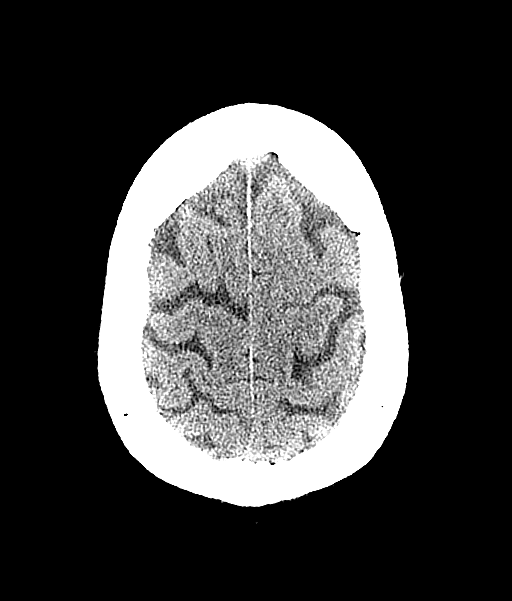
[im 70/78  brain]
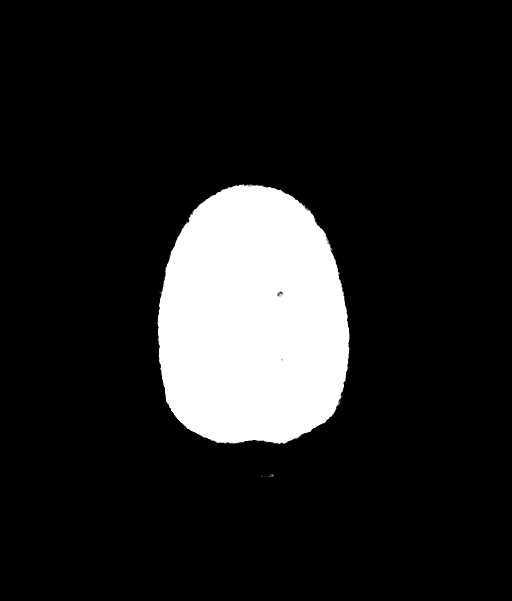

[14 of 47 positions shown; findings below may reference images not displayed]

All CT scans at this facility use dose modulation, interval reconstruction, and/or weight-based
dosing when appropriate to reduce radiation dose to as low as reasonably achievable.

Number of previous computed tomography exams in the last 12 months is 2. Number of previous nuclear
medicine myocardial perfusion studies in the last 12 months is 0.
FINDINGS: BRAIN:  NO evidence of posterior fossa/cerebellar hemorrhage or mass.

NO evidence of supratentorial hemorrhage or mass.

VENTRICLES:  The cerebral ventricles are normal in size.

BONES/JOINTS:  Calvarium and skull base demonstrate NO acute changes.

SOFT TISSUES:  Soft tissues demonstrate NO fluid collections or foreign body.

VASCULATURE:  Minimal arterial vascular calcification.

SINUSES:  The paranasal sinuses demonstrate NO significant opacifications or air-fluid levels.

MASTOID AIR CELLS:  LEFT mastoid air cells are clear.

Minimal patchy opacifications in the inferior RIGHT mastoid air cells.
IMPRESSION: - NO evidence of acute intracranial hemorrhage or mass.

- Minimal patchy opacifications in the inferior RIGHT mastoid air cells.  This may represent
mastoid sinusitis. NO acute appearing destructive changes or coalescence to suggest mastoiditis.

Tech Notes:

POSSIBLE SEIZURE TODAY.  LOC WITH TREMMORS.  HX. 2 PRIOR SEIZURES IN HER LIFE, MOST RECENT WAS 12+
YRS AGO.  AB
CT/NM: 2/0

## 2021-07-08 IMAGING — CR [ID]
2 series · 2 of 2 positions shown · non-contrast
Comparison: No relevant prior studies available.
COMPARISON: No relevant prior studies available.

DIAGNOSTIC STUDIES

EXAM:  XR RIGHT KNEE, 3 VIEWS  ([id number])
INDICATION: knee pain Right knee pain. Patient states that she was knocked to the ground by a pit
bull and she landed on her right knee. TB
TECHNIQUE: 3 views of the right knee.
TECHNIQUE: 2 views of the right tibia and fibula.

[knee ap]
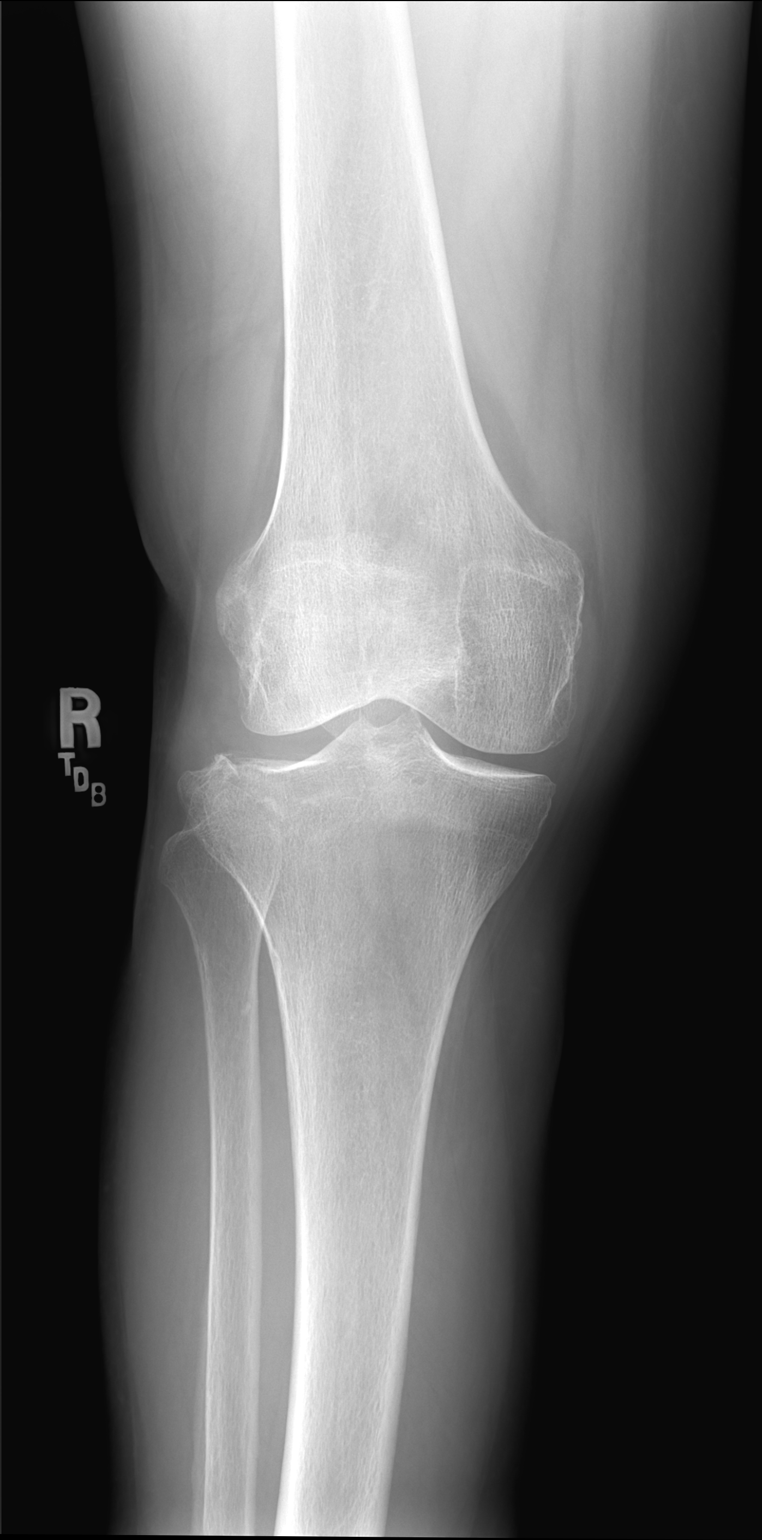

[knee lat]
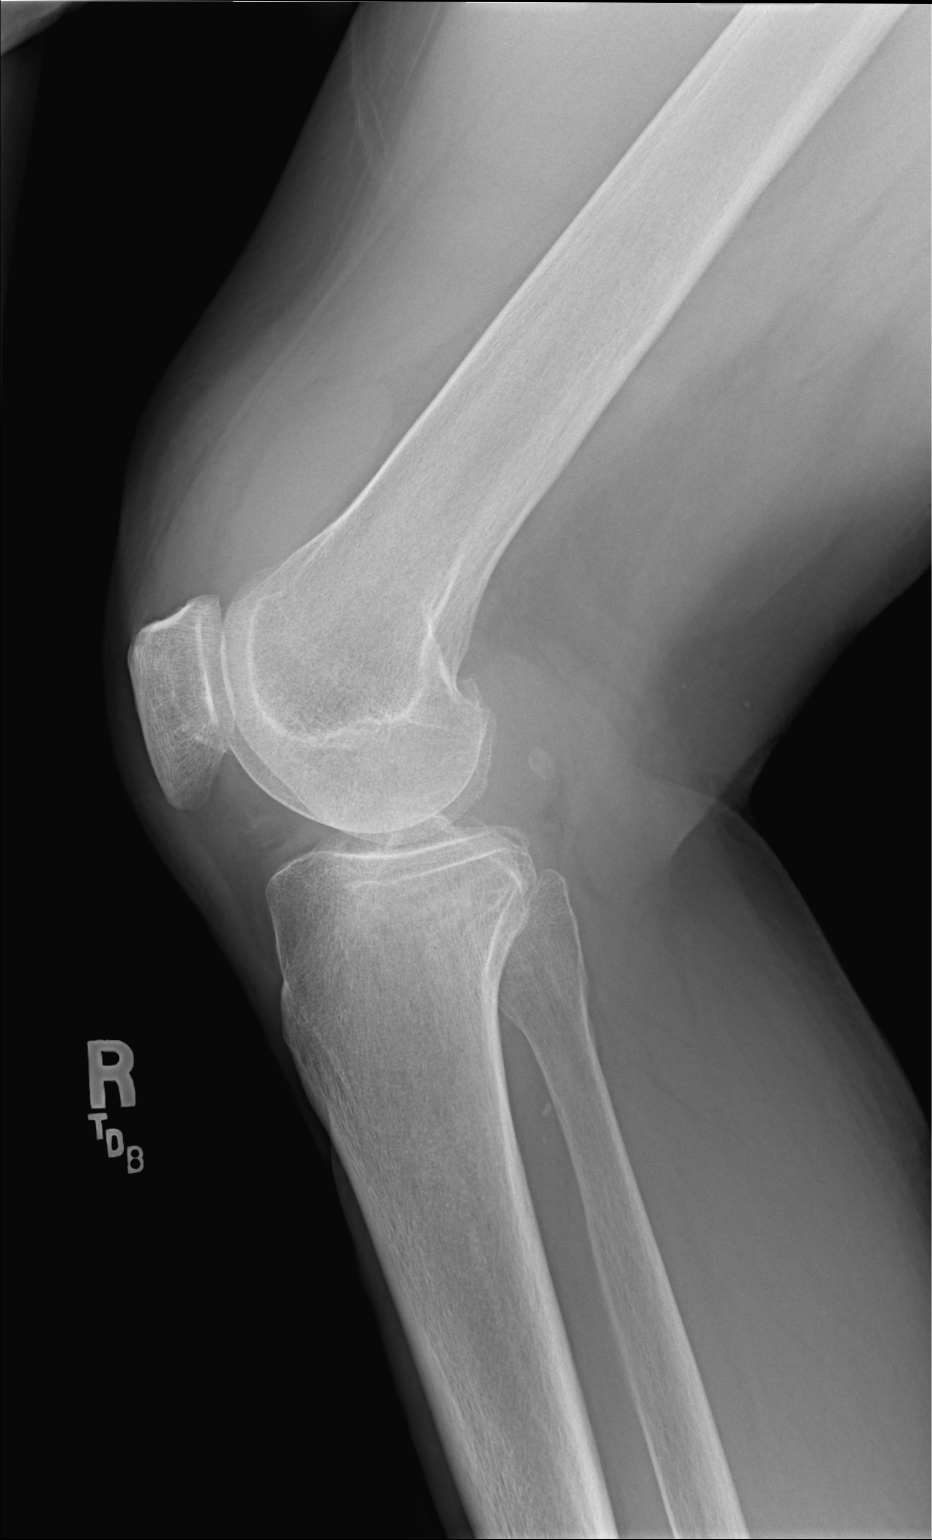

[2 of 2 positions shown; findings below may reference images not displayed]

FINDINGS: BONES/JOINTS:  Mild osteopenia of the visualized bony structures.

Mild to moderate degenerative changes from prior study and joint space loss in the patellofemoral
area.

Moderate to marked joint effusion.

Slight irregularity in the lateral tibial plateau.

SOFT TISSUES:  NO radiopaque foreign body.
IMPRESSION: - Moderate to marked joint effusion.

- Slight irregularity in the lateral tibial plateau.  This may represent fracture. If indicated,
follow-up should be considered with CT.

_______________________________________________

DIAGNOSTIC STUDIES

EXAM:  XR RIGHT TIBIA AND FIBULA, 2 VIEWS  ([id number])
FINDINGS: BONES/JOINTS:  There is NO evidence of acute diaphyseal/long bone fractures. Limited evaluation of
the joints.

SOFT TISSUES:  NO radiopaque foreign body.
IMPRESSION: - There is NO evidence of acute diaphyseal/long bone fractures.

Tech Notes:

Right knee pain. Patient states that she was knocked to the ground by a pit bull and she landed on
her right knee. TB

## 2021-07-08 IMAGING — CR TIBIART
1 series · 1 of 1 positions shown · non-contrast
Comparison: No relevant prior studies available.
COMPARISON: No relevant prior studies available.

DIAGNOSTIC STUDIES

EXAM:  XR RIGHT KNEE, 3 VIEWS  ([id number])
INDICATION: knee pain Right knee pain. Patient states that she was knocked to the ground by a pit
bull and she landed on her right knee. TB
TECHNIQUE: 3 views of the right knee.
TECHNIQUE: 2 views of the right tibia and fibula.

[tib fib lat]
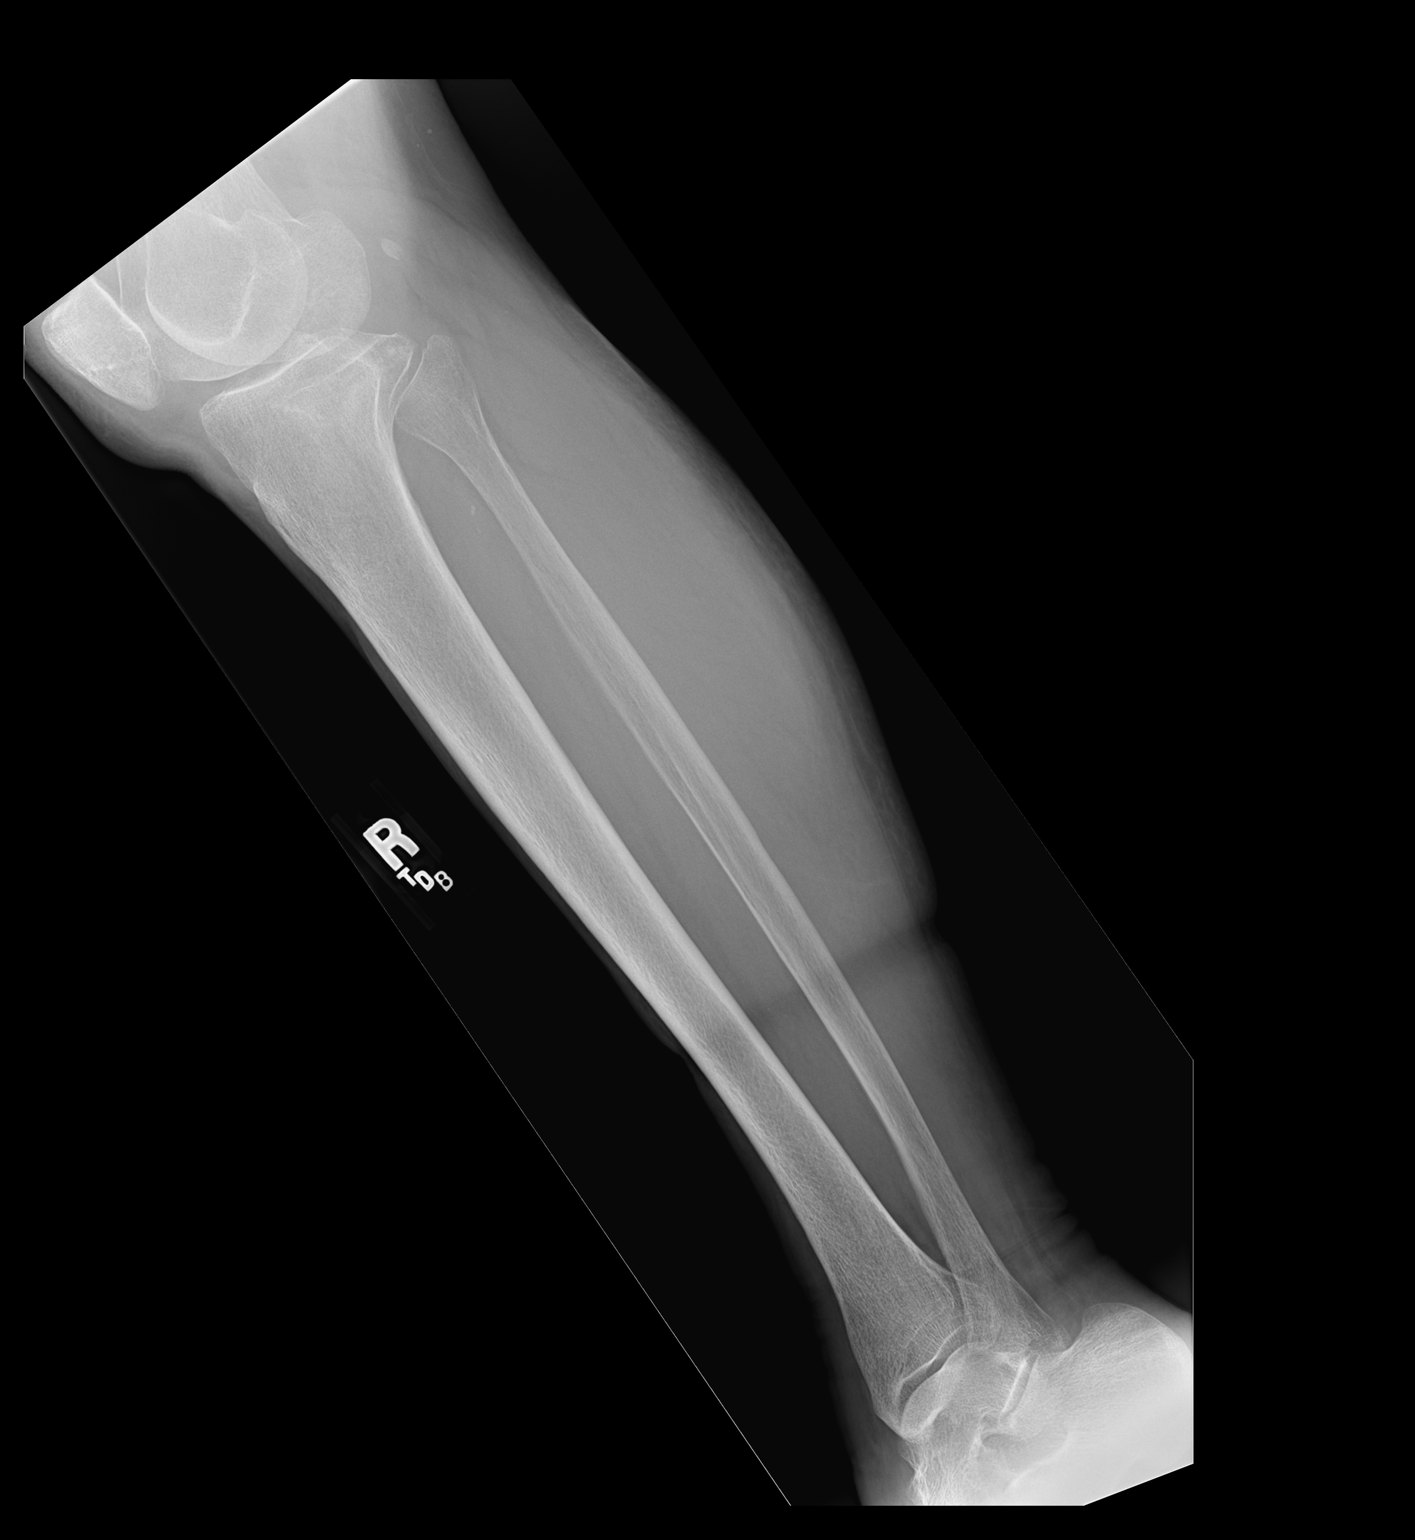

[1 of 1 positions shown; findings below may reference images not displayed]

FINDINGS: BONES/JOINTS:  Mild osteopenia of the visualized bony structures.

Mild to moderate degenerative changes from prior study and joint space loss in the patellofemoral
area.

Moderate to marked joint effusion.

Slight irregularity in the lateral tibial plateau.

SOFT TISSUES:  NO radiopaque foreign body.
IMPRESSION: - Moderate to marked joint effusion.

- Slight irregularity in the lateral tibial plateau.  This may represent fracture. If indicated,
follow-up should be considered with CT.

_______________________________________________

DIAGNOSTIC STUDIES

EXAM:  XR RIGHT TIBIA AND FIBULA, 2 VIEWS  ([id number])
FINDINGS: BONES/JOINTS:  There is NO evidence of acute diaphyseal/long bone fractures. Limited evaluation of
the joints.

SOFT TISSUES:  NO radiopaque foreign body.
IMPRESSION: - There is NO evidence of acute diaphyseal/long bone fractures.

Tech Notes:

Right lower leg pain. Patient states that she was knocked to the ground by a pit bull and it bit her
right lower leg. TB

## 2021-07-28 IMAGING — CR KNEELMRT
2 series · 2 of 2 positions shown · non-contrast
Comparison: none

[knee ap]
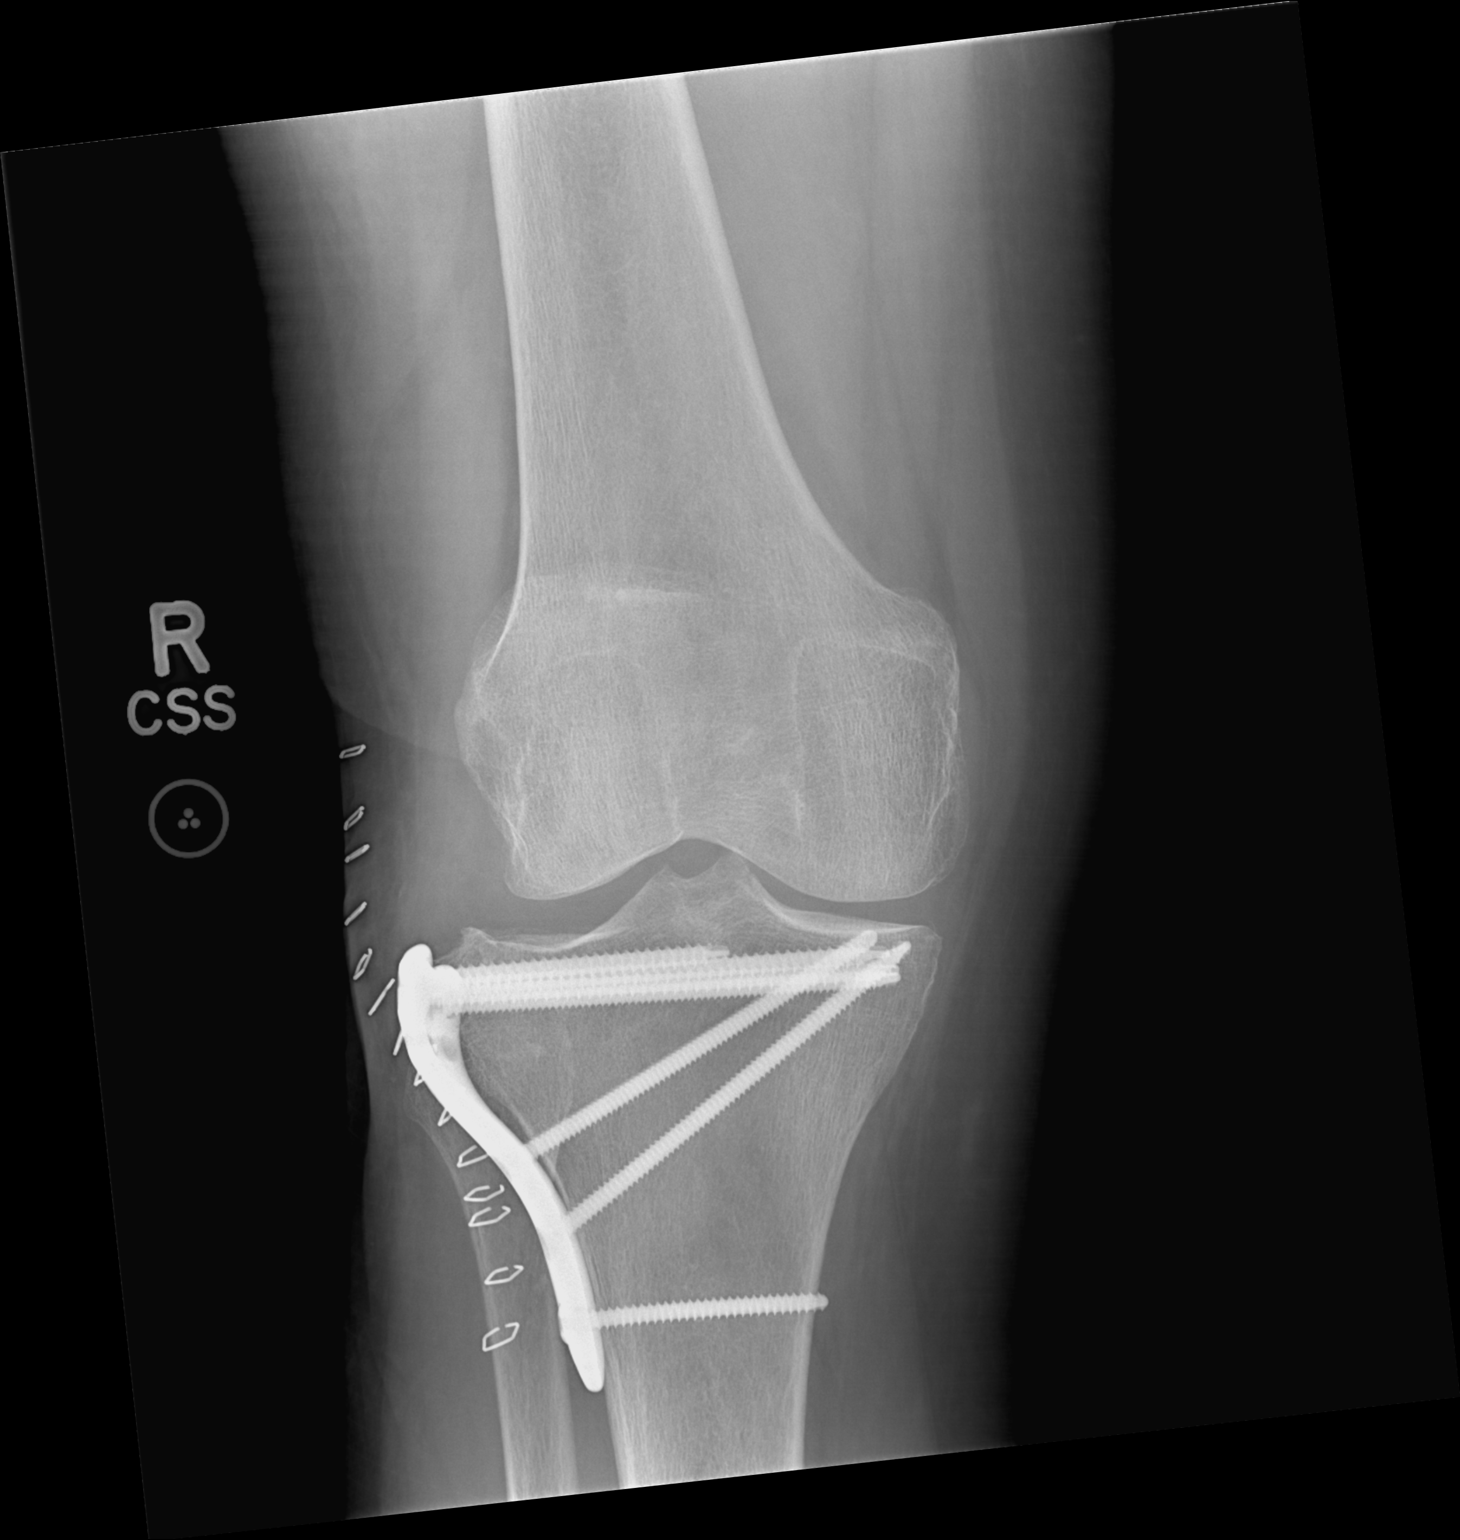

[knee lat]
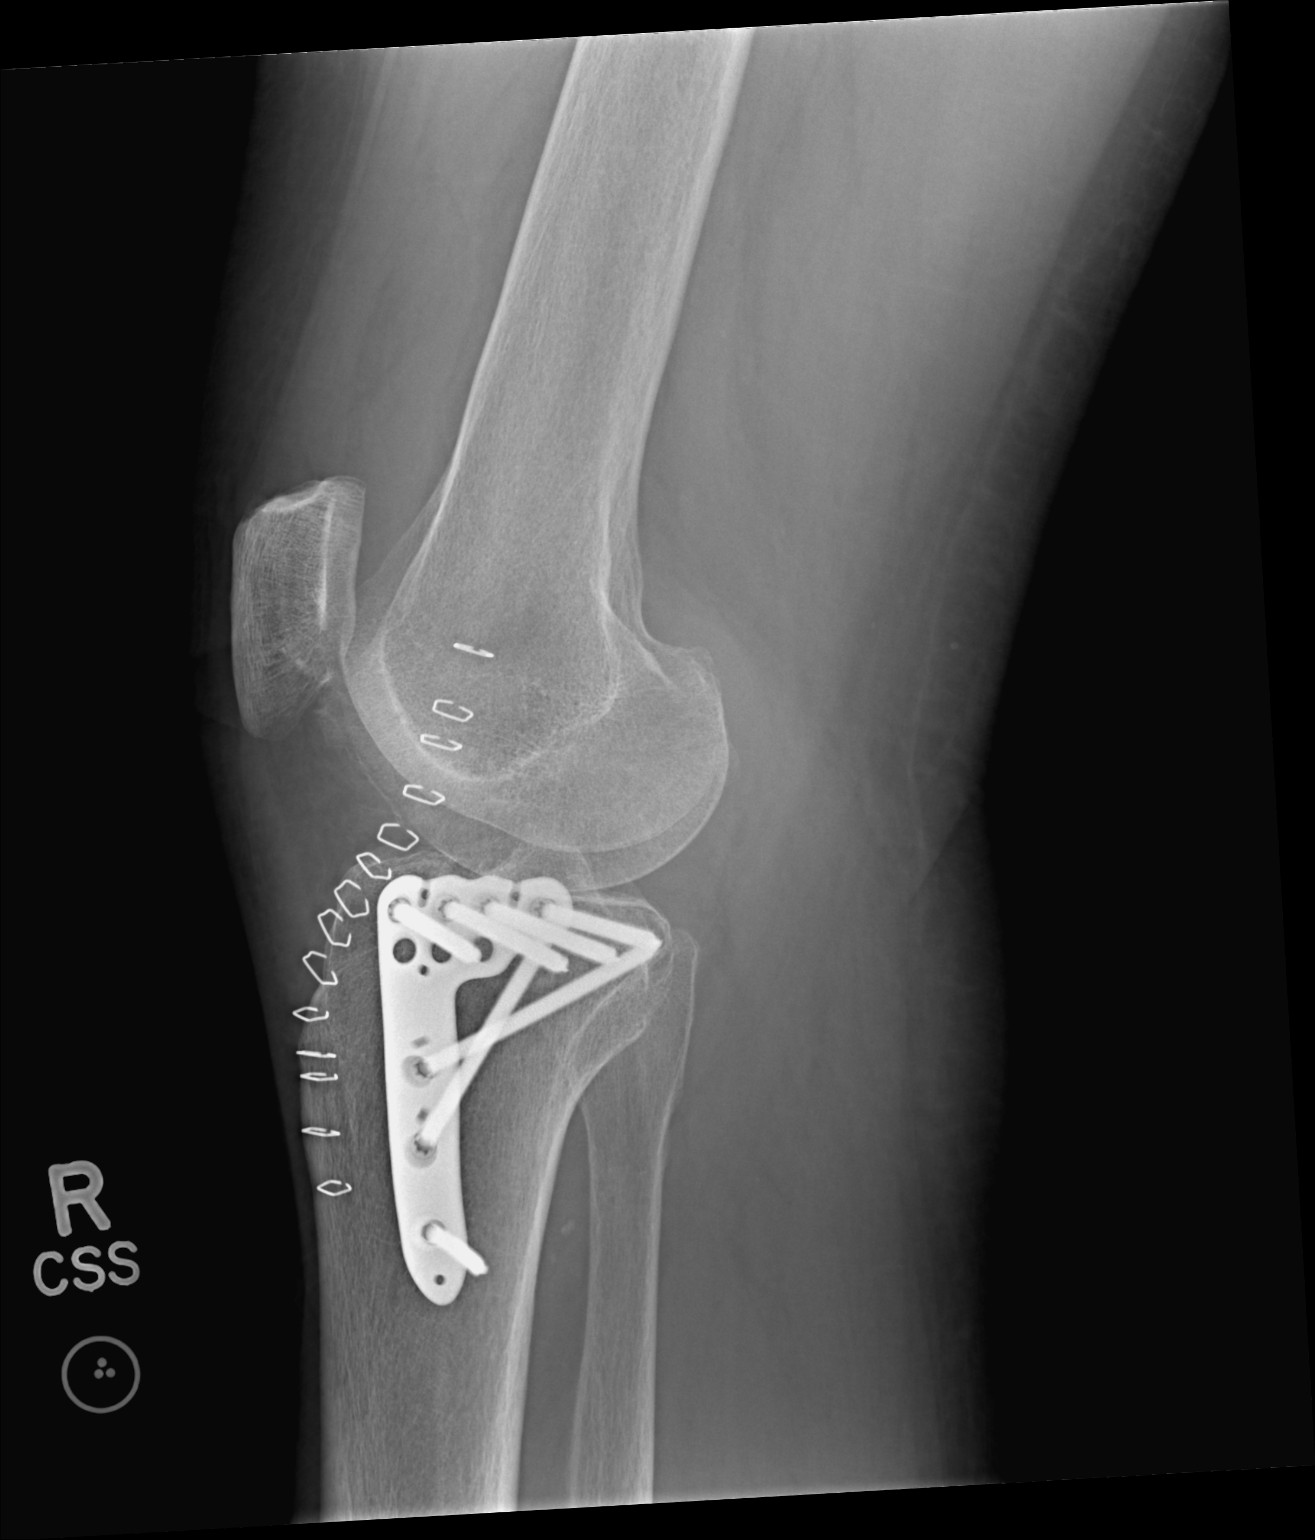

[2 of 2 positions shown; findings below may reference images not displayed]

DIAGNOSTIC STUDIES

EXAM

XR knee RT 2V

INDICATION

fx f/u
Post op Follow up.CS

TECHNIQUE

AP and lateral views

COMPARISONS

July 08, 2021

FINDINGS

Patient is status post ORIF of lateral tibial plateau fracture. Overlying skin staples are in
place. Small knee effusion is noted. No new fractures are seen.

IMPRESSION

Postop changes of the right knee demonstrating anatomic alignment.

Tech Notes:

Post op Follow up.CS

## 2021-09-01 IMAGING — CR KNEELMRT
2 series · 2 of 2 positions shown · non-contrast
Comparison: none

[knee ap]
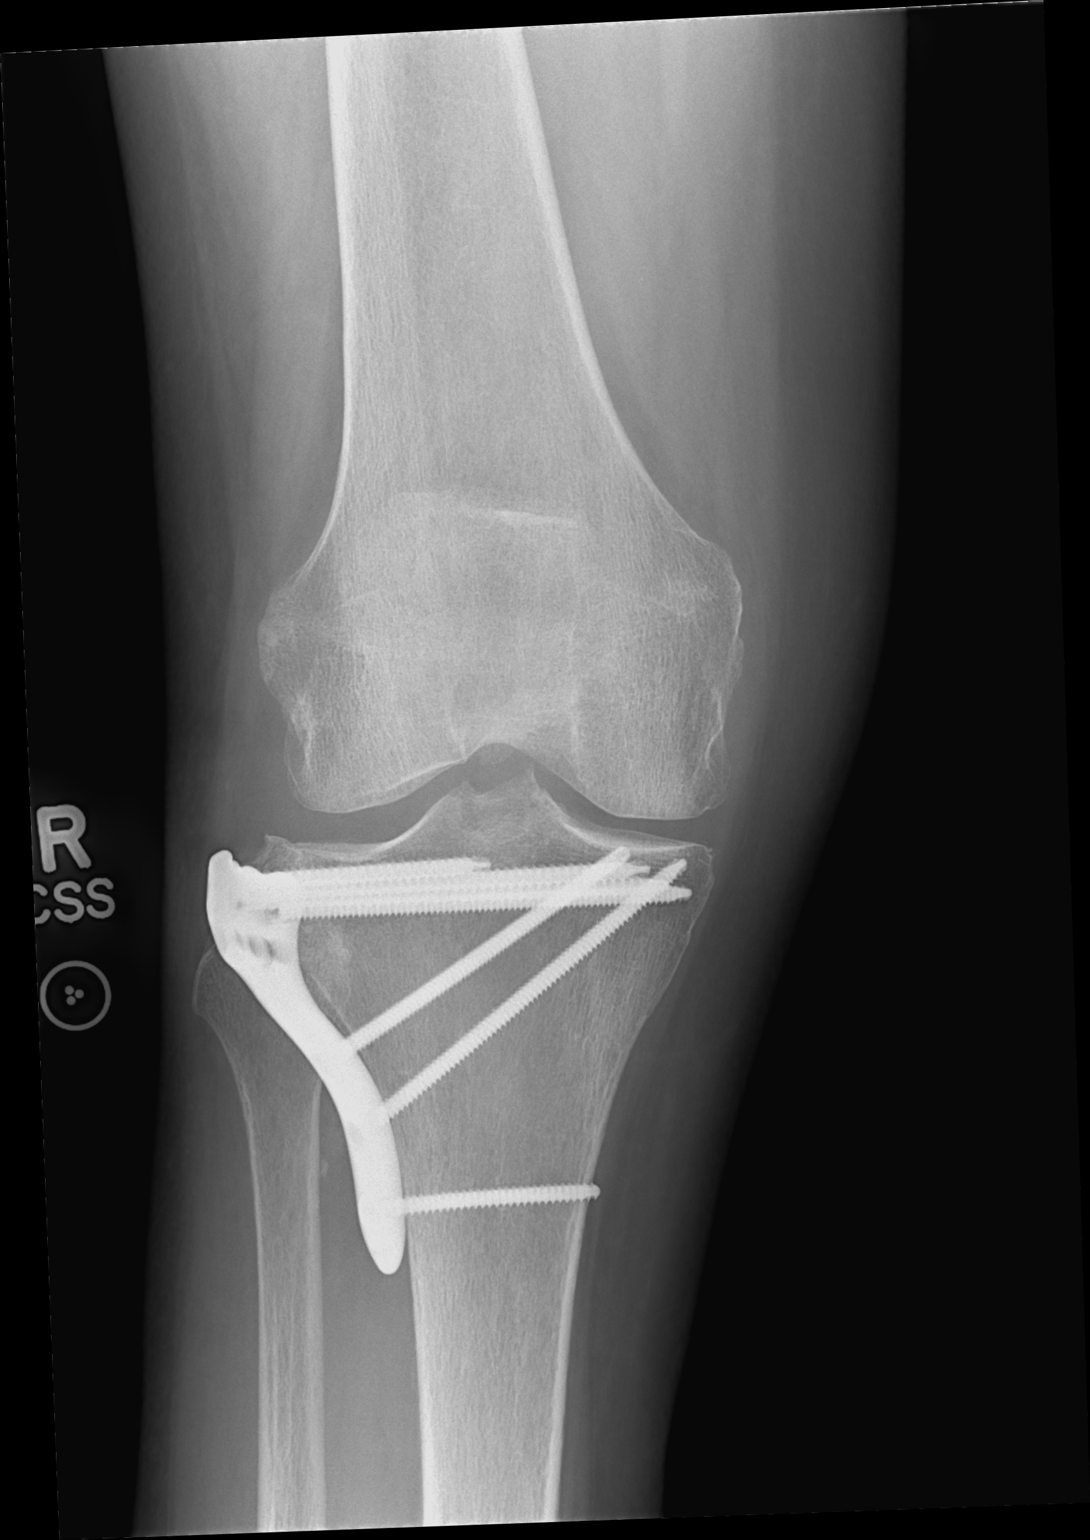

[knee lat]
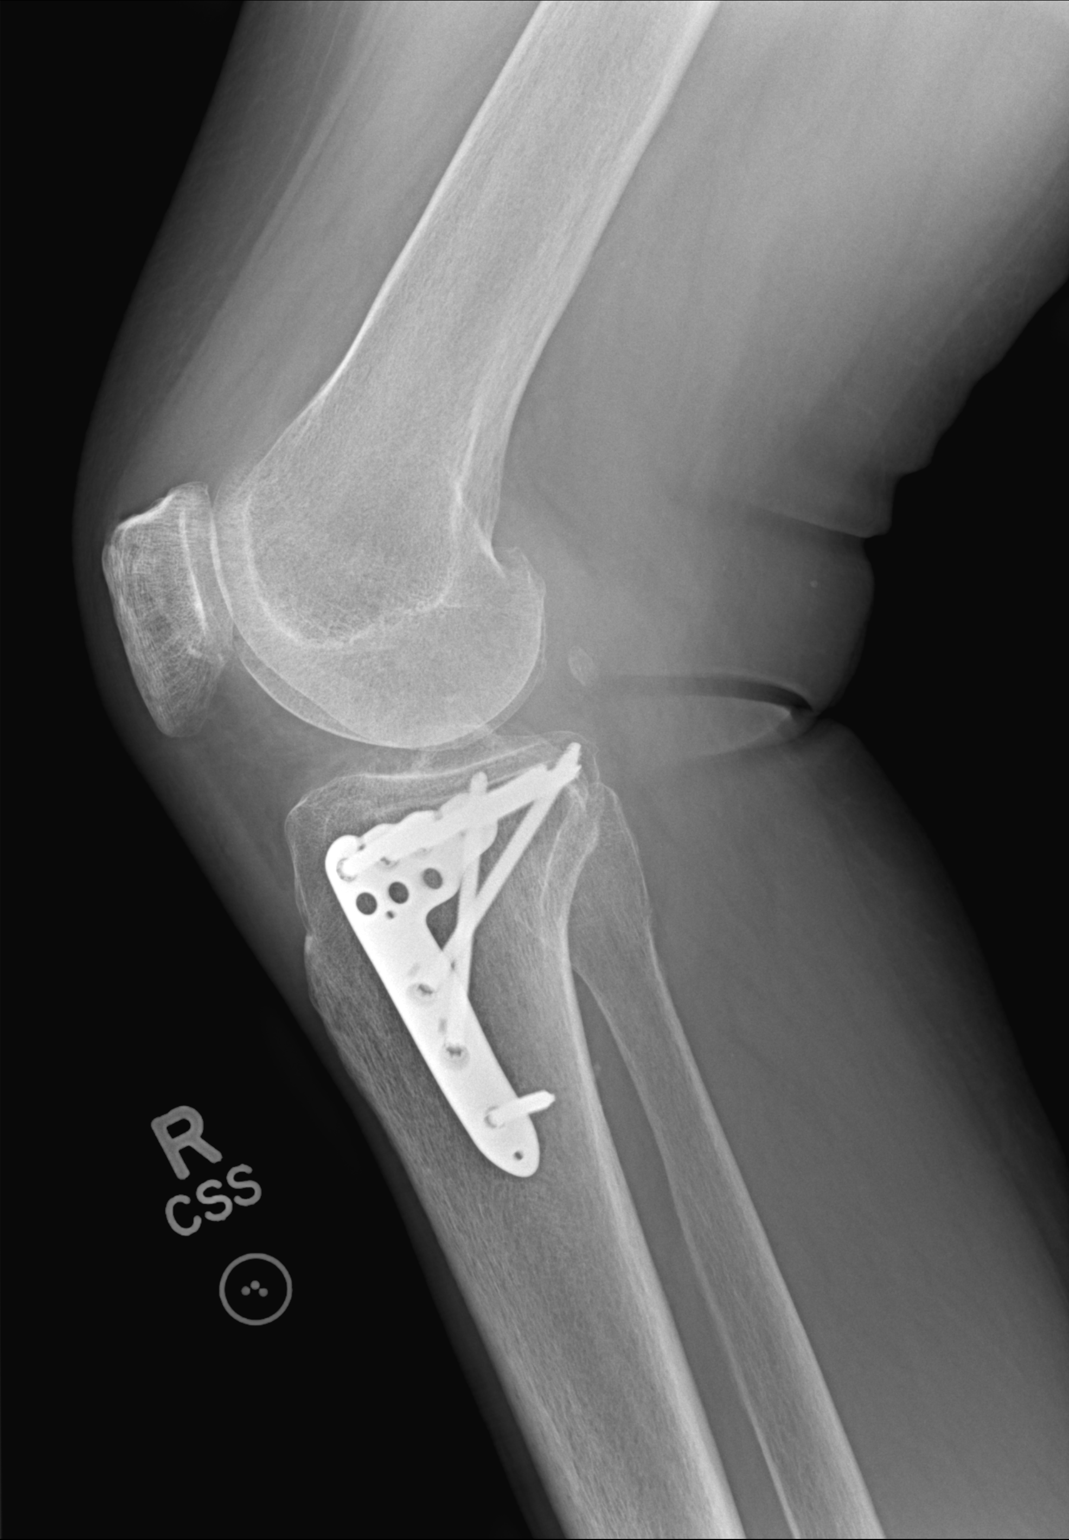

[2 of 2 positions shown; findings below may reference images not displayed]

DIAGNOSTIC STUDIES

EXAM

XR knee RT 2V

INDICATION

fx f/u
Follow up fx.CS

COMPARISONS

07/28/2021.

FINDINGS

ORIF for a fracture of the lateral tibial plateau. The hardware appears intact. Trace joint
effusion.

IMPRESSION

No significant change compared to the prior.

Tech Notes:

Follow up fx.CS

## 2021-09-11 ENCOUNTER — Encounter: Admit: 2021-09-11 | Discharge: 2021-09-11 | Payer: MEDICARE

## 2021-09-11 NOTE — Telephone Encounter
09-11-2021 Per Task Message, request faxed to Dr Burnadette Pop, (F) [phone], (P) [phone], requested records go to [facility], clp

## 2021-09-23 ENCOUNTER — Encounter: Admit: 2021-09-23 | Discharge: 2021-09-23 | Payer: MEDICARE

## 2021-09-24 ENCOUNTER — Encounter: Admit: 2021-09-24 | Discharge: 2021-09-24 | Payer: MEDICARE | Primary: Family

## 2021-09-24 DIAGNOSIS — I10 Essential (primary) hypertension: Secondary | ICD-10-CM

## 2021-09-25 ENCOUNTER — Encounter: Admit: 2021-09-25 | Discharge: 2021-09-25 | Payer: MEDICARE | Primary: Family

## 2021-09-25 ENCOUNTER — Ambulatory Visit: Admit: 2021-09-25 | Discharge: 2021-09-26 | Payer: MEDICARE | Primary: Family

## 2021-09-25 DIAGNOSIS — G4733 Obstructive sleep apnea (adult) (pediatric): Secondary | ICD-10-CM

## 2021-09-25 DIAGNOSIS — Z9889 Other specified postprocedural states: Secondary | ICD-10-CM

## 2021-09-25 DIAGNOSIS — E7800 Pure hypercholesterolemia, unspecified: Secondary | ICD-10-CM

## 2021-09-25 DIAGNOSIS — I10 Essential (primary) hypertension: Secondary | ICD-10-CM

## 2021-09-25 DIAGNOSIS — M5136 Other intervertebral disc degeneration, lumbar region: Secondary | ICD-10-CM

## 2021-09-25 DIAGNOSIS — Z136 Encounter for screening for cardiovascular disorders: Secondary | ICD-10-CM

## 2021-09-25 DIAGNOSIS — S81852A Open bite, left lower leg, initial encounter: Secondary | ICD-10-CM

## 2021-09-25 DIAGNOSIS — M4802 Spinal stenosis, cervical region: Secondary | ICD-10-CM

## 2021-09-25 DIAGNOSIS — Z8249 Family history of ischemic heart disease and other diseases of the circulatory system: Secondary | ICD-10-CM

## 2021-09-25 DIAGNOSIS — R0989 Other specified symptoms and signs involving the circulatory and respiratory systems: Secondary | ICD-10-CM

## 2021-09-25 DIAGNOSIS — M5030 Other cervical disc degeneration, unspecified cervical region: Secondary | ICD-10-CM

## 2021-09-25 MED ORDER — LISINOPRIL 2.5 MG PO TAB
2.5 mg | ORAL_TABLET | Freq: Two times a day (BID) | ORAL | 3 refills | Status: AC
Start: 2021-09-25 — End: ?

## 2021-09-25 NOTE — Progress Notes
Date of Service: 09/25/2021    Mindy Johnston is a 76 y.o. female.       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 white  female currently resident of the [facility] and active at the [facility] in [place], [place], she does have a history of hypertension, hyperlipidemia, OSA using a CPAP machine, osteopenia, cervical spine stenosis and status post previous spine surgery, patient presents to our office today for hypertension treatment.    Unfortunately, on 07/08/2021 while out for a walk, [name] was involved in a dog bite incident, she was walking and trying to get 10,000 steps a day, she was attacked by a pit bull, fell to the ground, she suffered a right tibial plateau fracture that required orthopedic surgical intervention, also another wound on the medial part of the right thigh and another one on the medial part of the left lower leg below the knee, she still has a hematoma at that site.    Unfortunately, she did suffer an infection to these wounds, this required antibiotic treatment, she is currently undergoing physical therapy and she is feeling much better.    Patient does have a longstanding history of hypertension, she does not have any documented history of coronary artery disease.  She does not report any symptoms of chest pain and no heart palpitations.    She does take lisinopril 5 mg p.o. daily for hypertension, this was recently increased from 2.5 mg p.o. daily.    There are some home recordings of the blood pressure and the heart rate and there is some variations, but probably do not really meet the criteria for labile hypertension.  Patient does report that she does recall that on 1 occasion she recorded her blood pressure in the morning that it was 124/67 mmHg, and then in the evening the same day it was 193/88 mmHg.    Family history: Father underwent bypass x2 (5 vessel bypass and then four-vessel bypass), she does have a brother that suffered a myocardial infarction and underwent bypass, another brother that suffered a myocardial infarction.    Social history: Patient is a Financial controller, she is involved in spiritual education.         Vitals:    09/25/21 1312 09/25/21 1332   BP: (!) 158/82 (!) 148/78   BP Source: Arm, Left Upper Arm, Right Upper   Pulse: 66    SpO2: 98%    O2 Percent: 98 %    O2 Device: None (Room air)    PainSc: Two    Weight: 68.3 kg (150 lb 9.6 oz)    Height: 152.4 cm (5')      Body mass index is 29.41 kg/m?Marland Kitchen     Past Medical History  Patient Active Problem List    Diagnosis Date Noted   ? Hypertension 09/24/2021     -10/24/15- [facility]- Echo- Normal LV systolic function. LV EF 70%. Mild LV hypertrophy. Mild tricuspid regurgitation. Right ventricular systolic pressure is consistent with moderate pulmonary hypertension. Estimated peak RSVP is . No significant pericardial effusion.   -10/24/15- [facility]- Treadmill Stress Echo- No exercise induced chest pain. No EKG evidence of ischemia.  Normal hemodynamic response to exercise. No significant exercise induced arrhythmias.      ? Hyperlipidemia 09/24/2021   ? OSA (obstructive sleep apnea) 09/24/2021   ? S/P spinal surgery 06/15/2017   ? Osteoporosis 06/15/2017   ? 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   HENT: Negative.    Eyes: Negative.    Cardiovascular: Positive for dyspnea on exertion and leg swelling.   Respiratory: Negative.    Endocrine: Negative.    Hematologic/Lymphatic: Negative.    Skin: Negative.    Musculoskeletal: Positive for back pain, falls, joint pain, myalgias and neck pain.   Gastrointestinal: Negative.    Genitourinary: Negative.    Neurological: Positive for numbness and paresthesias.   Psychiatric/Behavioral: Negative.    Allergic/Immunologic: Negative.        Physical Exam  General Appearance: normal in appearance  Skin: warm, moist, no ulcers or xanthomas  Eyes: conjunctivae and lids normal, pupils are equal and round  Lips & Oral Mucosa: no pallor or cyanosis  Neck Veins: neck veins are flat, neck veins are not distended  Chest Inspection: chest is normal in appearance  Respiratory Effort: breathing comfortably, no respiratory distress  Auscultation/Percussion: lungs clear to auscultation, no rales or rhonchi, no wheezing  Cardiac Rhythm: regular rhythm and normal rate  Cardiac Auscultation: S1, S2 normal, no rub, no gallop  Murmurs: II/VI SE murmur  Carotid Arteries: normal carotid upstroke bilaterally, bilateral bruits  Lower Extremity Edema: no lower extremity edema, left lower extremity hematoma on the medial part below the knee, right lower extremity postsurgical scar, status post dog bite  Abdominal Exam: soft, non-tender, no masses, bowel sounds normal  Liver & Spleen: no organomegaly  Language and Memory: patient responsive and seems to comprehend information  Neurologic Exam: neurological assessment grossly intact      Cardiovascular Studies  Twelve-lead EKG demonstrates normal sinus rhythm, no ST segment T wave changes, no axis deviation, ventricular rate 66 bpm, normal PT/QT/QTc intervals    Cardiovascular Health Factors  Vitals BP Readings from Last 3 Encounters:   09/25/21 (!) 148/78   09/22/17 99/48   07/07/17 133/58     Wt Readings from Last 3 Encounters:   09/25/21 68.3 kg (150 lb 9.6 oz)   09/22/17 61.2 kg (135 lb)   07/07/17 61.2 kg (135 lb)     BMI Readings from Last 3 Encounters:   09/25/21 29.41 kg/m?   09/22/17 26.37 kg/m?   07/07/17 26.37 kg/m?      Smoking Social History     Tobacco Use   Smoking Status Never   Smokeless Tobacco Never      Lipid Profile Cholesterol   Date Value Ref Range Status   08/13/2021 164  Final     HDL   Date Value Ref Range Status   08/13/2021 40  Final     LDL   Date Value Ref Range Status   08/13/2021 104 (H) <100 Final     Triglycerides   Date Value Ref Range Status   08/13/2021 100  Final      Blood Sugar No results found for: HGBA1C  Glucose   Date Value Ref Range Status   07/11/2021 61 (L) 70 - 105 Final          Problems Addressed Today  Encounter Diagnoses   Name Primary?   ? Pure hypercholesterolemia Yes   ? Primary hypertension    ? Cervical stenosis of spinal canal    ? Degenerative disc disease, cervical    ? OSA (obstructive sleep apnea)    ?  Degenerative disc disease, lumbar    ? S/P spinal surgery    ? Screening for heart disease    ? Bruit    ? Dog bite of left calf, initial encounter        Assessment and Plan     Assessment:    1.  Primary hypertension-currently patient is on a relatively small dose of an ACE inhibitor, there are some variations in her blood pressure but perhaps not to the extent to label these as labile hypertension.  2.  No documented history of coronary artery disease  3.  Hyperlipidemia-patient is not on statin therapy  Cholesterol profile dated 08/13/2021 demonstrated the following: TC = 164 mg/dL, TG = 161 mg/dL, HDL = 40 mg/dL, and LDL = 096 mg/dL,  5.  Family history of premature coronary artery disease  ? Father underwent CABG x2  ? One brother underwent CABG and had an MI  ? One brother had an MI and underwent stent placement  6.  OSA using a BiPAP machine  7.  Osteoarthritis, status post cervical spine injury  8.  History of dog bite with several injuries, subsequent infectious and status post surgical intervention and antibiotic treatment  9.  Bilateral carotid bruit-this was a finding today on the physical exam    Plan:    1.  Split lisinopril in 2.5 mg p.o. twice daily  2.  Further evaluation with a 2D echo Doppler study and a carotid duplex-we will follow-up on the results and will make further recommendations  3.  I did recommend a strict low-sodium diet  4.  Once patient is recovered after the recent accident/injuries that she sustained we will also plan on evaluation with a perfusion imaging study  5.  Follow-up office visit in approximately 6 weeks    Total Time Today was 60 minutes in the following activities: Preparing to see the patient, Obtaining and/or reviewing separately obtained history, Performing a medically appropriate examination and/or evaluation, Counseling and educating the patient/family/caregiver, Ordering medications, tests, or procedures, Referring and communication with other health care professionals (when not separately reported), Documenting clinical information in the electronic or other health record, Independently interpreting results (not separately reported) and communicating results to the patient/family/caregiver and Care coordination (not separately reported)         Current Medications (including today's revisions)  ? acetaminophen SR (TYLENOL) 650 mg tablet Take 650 mg by mouth every 6 hours as needed for Pain.   ? albuterol sulfate (PROAIR HFA) 90 mcg/actuation HFA aerosol inhaler Inhale 2 puffs by mouth into the lungs every 4 hours as needed for Wheezing or Shortness of Breath. Shake well before use.   ? ascorbic acid (vitamin C) 500 mg tablet Take 500 mg by mouth daily.   ? calcium carbonate-vitamin D3 600 mg-20 mcg (800 unit) tab Take 1 tablet by mouth daily.   ? coenzyme Q10 200 mg capsule Take 200 mg by mouth daily.   ? gabapentin (NEURONTIN) 300 mg capsule Take 600 mg by mouth daily.   ? HYDROcodone/acetaminophen (NORCO) 5/325 mg tablet Take 1 tablet by mouth every 4-6 hours as needed.   ? lisinopril (ZESTRIL) 5 mg tablet Take 5 mg by mouth daily.   ? magnesium oxide (MAGOX) 400 mg (241.3 mg magnesium) tablet Take 400 mg by mouth daily.   ? melatonin 5 mg tablet Take 5 mg by mouth at bedtime as needed.   ? naproxen sodium (ALEVE) 220 mg tablet Take 1 tablet by mouth daily. Take  with food.   ? omega-3 fatty acids/fish oil (FISH OIL-OMEGA-3 FATTY ACIDS) 300-1,000 mg cap Take 1 capsule by mouth daily.   ? TURMERIC-TURMERIC ROOT EXTRACT PO Take 500 mg by mouth daily.   ? vitamin E 400 unit capsule Take 400 Units by mouth daily.   ? vitamins, B complex tablet(+) (B COMPLEX) capsule Take 1 capsule by mouth daily.   ? Zinc Gluconate 100 mg tab Take 1 tablet by mouth daily.

## 2021-09-25 NOTE — Patient Instructions
Thank you for visiting our office today.    We would like to make the following medication adjustments:      Lisinopril 2.5mg  twice daily      Otherwise continue the same medications as you have been doing.          We will be pursuing the following tests after your appointment today:       Orders Placed This Encounter    ECG 12-LEAD    2D + DOPPLER ECHO    lisinopril (ZESTRIL) 2.5 mg tablet    PV CAROTID ARTERY DUPLEX SCAN         We will plan to see you back in 6-8 weeks.  Please call us in the meantime with any questions or concerns.        Please allow 5-7 business days for our providers to review your results. All normal results will go to MyChart. If you do not have Mychart, it is strongly recommended to get this so you can easily view all your results. If you do not have mychart, we will attempt to call you once with normal lab and testing results. If we cannot reach you by phone with normal results, we will send you a letter.  If you have not heard the results of your testing after one week please give [place] a call.       Your Cardiovascular [facility] [place] Team Brett Canales, Pilar Jarvis, Shawna Orleans, and [place])  phone number is [phone].

## 2021-10-01 ENCOUNTER — Ambulatory Visit: Admit: 2021-10-01 | Discharge: 2021-10-01 | Payer: MEDICARE | Primary: Family

## 2021-10-01 ENCOUNTER — Encounter: Admit: 2021-10-01 | Discharge: 2021-10-01 | Payer: MEDICARE | Primary: Family

## 2021-10-01 DIAGNOSIS — E7800 Pure hypercholesterolemia, unspecified: Secondary | ICD-10-CM

## 2021-10-01 DIAGNOSIS — I10 Essential (primary) hypertension: Secondary | ICD-10-CM

## 2021-10-01 IMAGING — US CARDUPBI
1 series · 14 of 16 positions shown · non-contrast
Comparison: none

[Series 1: us carotid duplex bi · 14 of 63 slices shown]
[im 1/63]
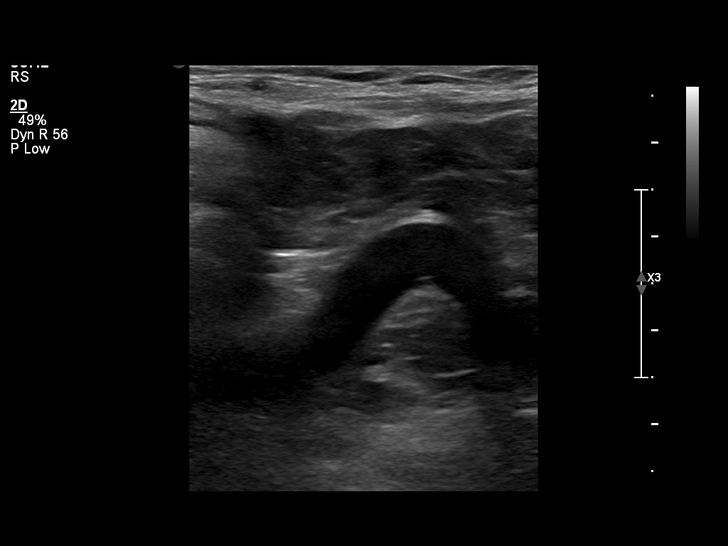
[im 5/63]
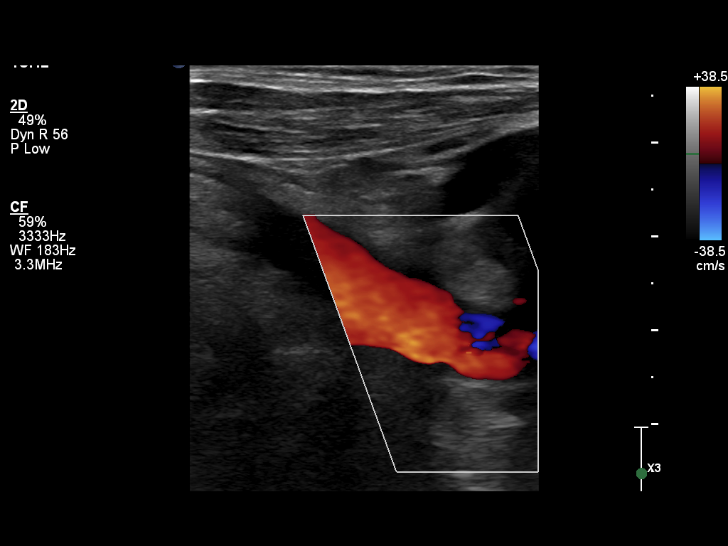
[im 9/63]
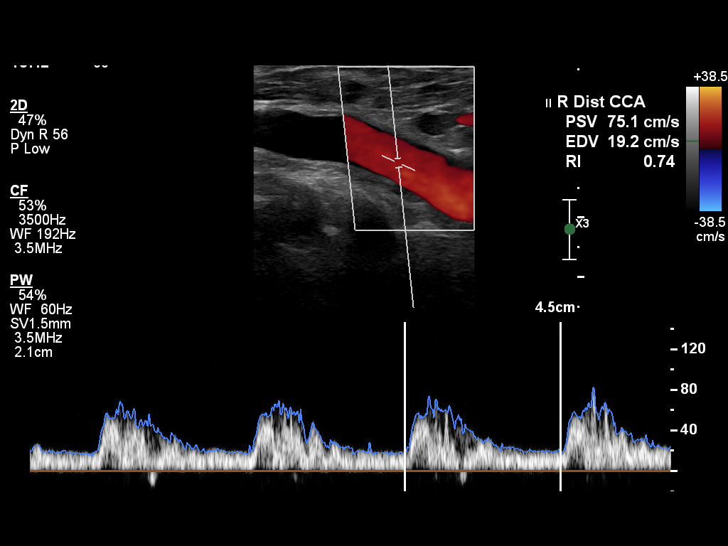
[im 17/63]
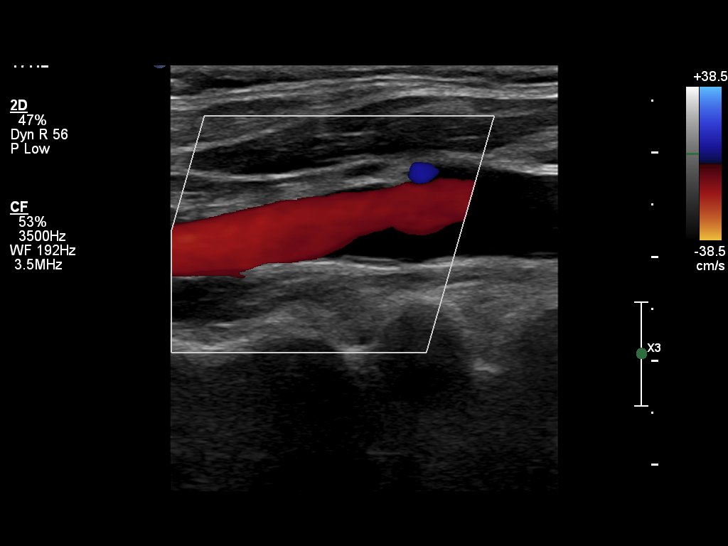
[im 21/63]
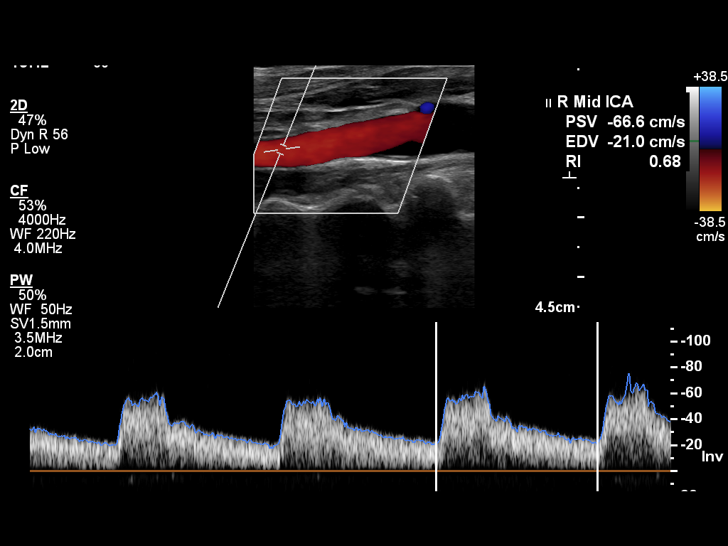
[im 25/63]
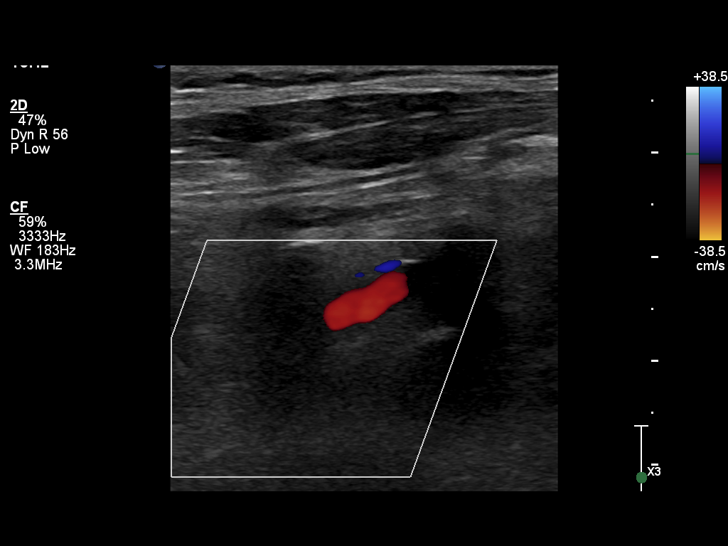
[im 29/63]
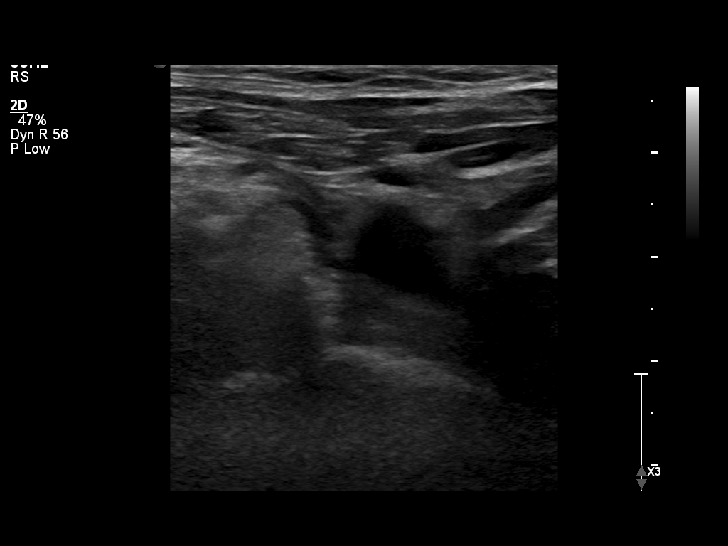
[im 34/63]
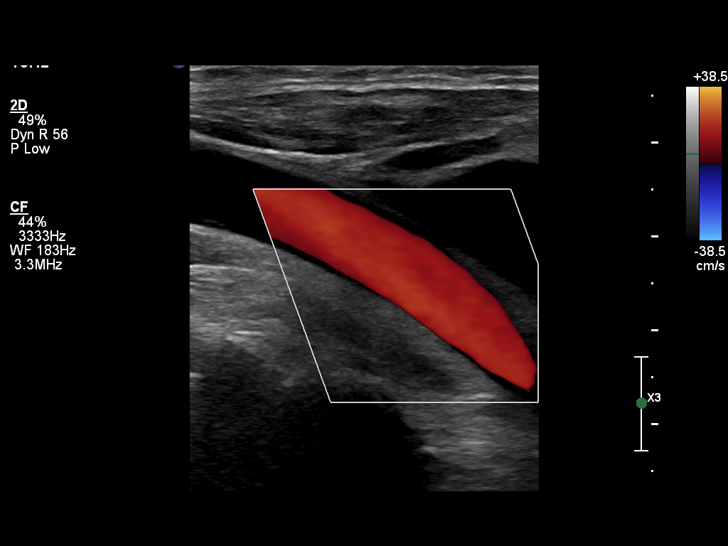
[im 38/63]
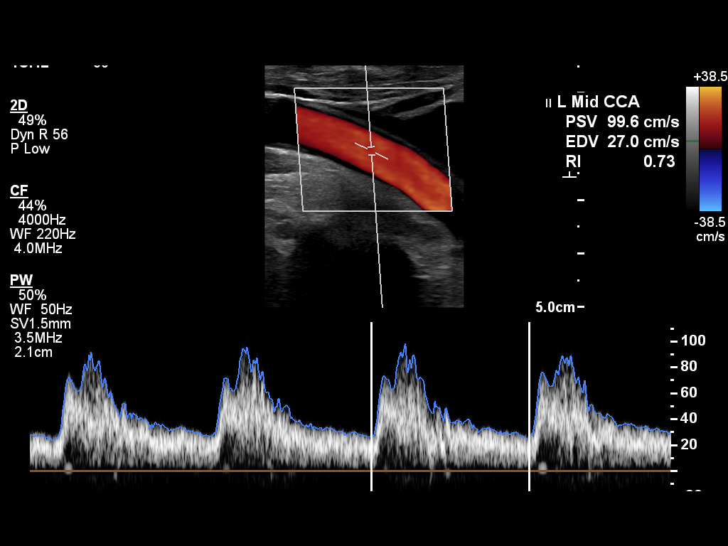
[im 42/63]
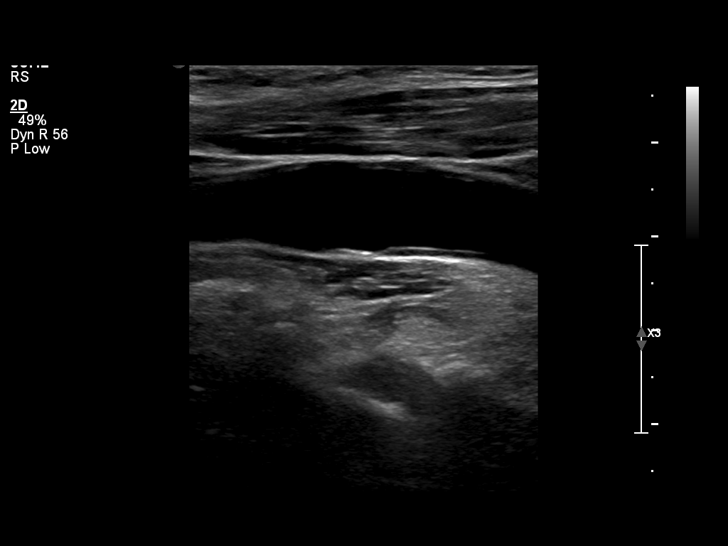
[im 50/63]
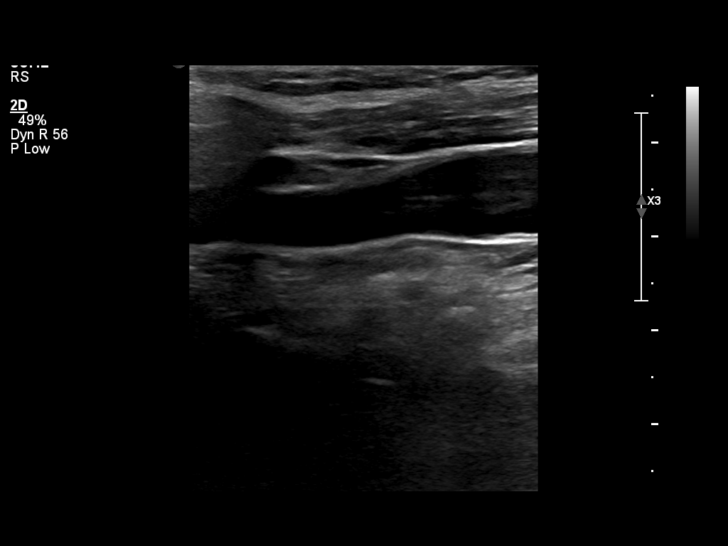
[im 54/63]
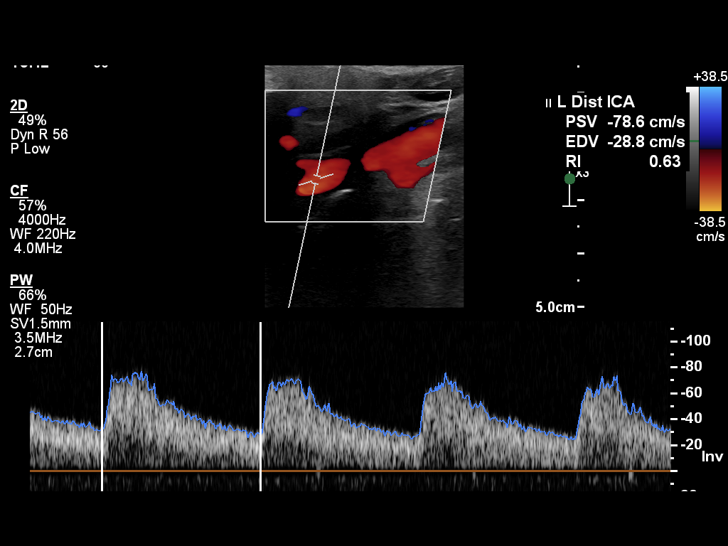
[im 58/63]
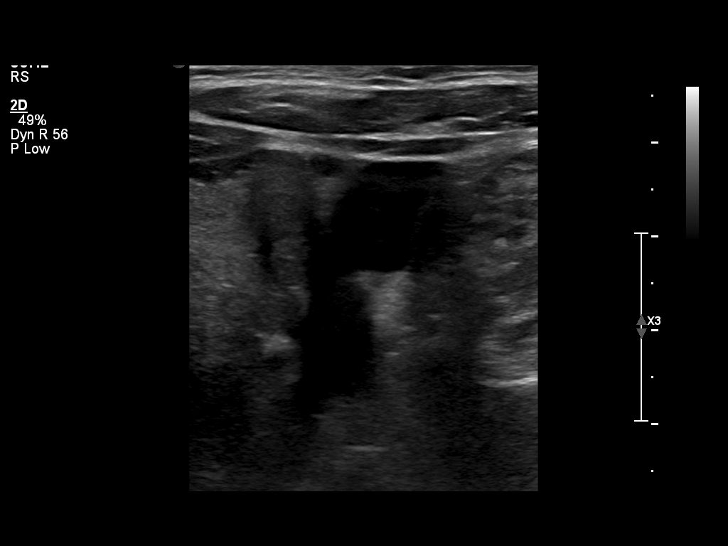
[im 63/63]
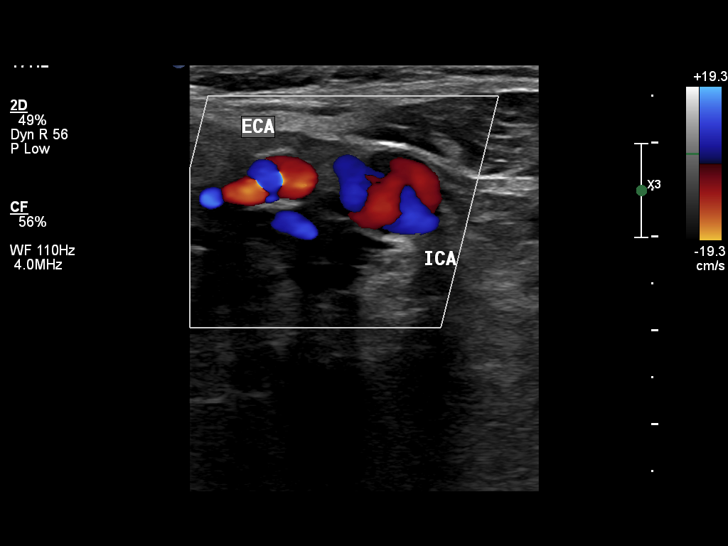

[14 of 16 positions shown; findings below may reference images not displayed]

EXAM

Victor Hugo Irigoyen

INDICATION

Hyper cholesterol [employer]

TECHNIQUE

Grayscale and color doppler evaluation with spectral analysis of the carotids was performed

COMPARISONS

None available at the time of dictation.

FINDINGS

RIGHT:

Grayscale evaluation of plaque: No significant plaque.

CCA: 78.9 cm/s

ICA: 66.6 cm/s

ECA: 97.3 cm/s

Vertebral artery: 67.2 cm/s, forward flow

ICA/CCA ratio:

LEFT:

Grayscale evaluation of plaque: No significant plaque.

CCA: 98.4 cm/s

ICA: 78.6 cm/s

ECA: 97.8 cm/s

Vertebral artery: 61.2 cm/s, forward flow

ICA/CCA ratio:

IMPRESSION

No hemodynamically significant stenosis of the right carotid system.

No hemodynamically significant stenosis of the left carotid system.

Tech Notes:

htn and hypercholesterolemia

## 2021-10-01 NOTE — Progress Notes
Will route to Dr. Avie Arenas for review and recommendations.

## 2021-10-02 ENCOUNTER — Encounter: Admit: 2021-10-02 | Discharge: 2021-10-02 | Payer: MEDICARE | Primary: Family

## 2021-10-02 ENCOUNTER — Ambulatory Visit: Admit: 2021-10-02 | Discharge: 2021-10-02 | Payer: MEDICARE | Primary: Family

## 2021-10-02 DIAGNOSIS — E7800 Pure hypercholesterolemia, unspecified: Secondary | ICD-10-CM

## 2021-10-02 DIAGNOSIS — I10 Essential (primary) hypertension: Secondary | ICD-10-CM

## 2021-10-09 ENCOUNTER — Encounter: Admit: 2021-10-09 | Discharge: 2021-10-09 | Payer: MEDICARE | Primary: Family

## 2021-10-09 NOTE — Telephone Encounter
Left message with normal results.  Left callback number for any questions or concerns.

## 2021-10-09 NOTE — Telephone Encounter
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    The echocardiogram was normal as well, normal heart function, no abnormalities of the heart valves.    IV started on a small dose of antihypertensive drug therapy and I think she is supposed to follow-up with me in 6 weeks from the last appointment.    Thank you      ----- Message -----  From: Dorris Fetch, MD  Sent: 10/03/2021  11:39 AM CST  To: Dorris Fetch, MD

## 2021-11-17 ENCOUNTER — Encounter: Admit: 2021-11-17 | Discharge: 2021-11-17 | Payer: MEDICARE | Primary: Family

## 2021-11-18 ENCOUNTER — Encounter: Admit: 2021-11-18 | Discharge: 2021-11-18 | Payer: MEDICARE | Primary: Family

## 2021-11-18 ENCOUNTER — Ambulatory Visit: Admit: 2021-11-18 | Discharge: 2021-11-19 | Payer: MEDICARE | Primary: Family

## 2021-11-18 DIAGNOSIS — S81852A Open bite, left lower leg, initial encounter: Secondary | ICD-10-CM

## 2021-11-18 DIAGNOSIS — I10 Essential (primary) hypertension: Secondary | ICD-10-CM

## 2021-11-18 DIAGNOSIS — M48062 Spinal stenosis, lumbar region with neurogenic claudication: Secondary | ICD-10-CM

## 2021-11-18 DIAGNOSIS — E7800 Pure hypercholesterolemia, unspecified: Secondary | ICD-10-CM

## 2021-11-18 DIAGNOSIS — Z9889 Other specified postprocedural states: Secondary | ICD-10-CM

## 2021-11-18 DIAGNOSIS — G4733 Obstructive sleep apnea (adult) (pediatric): Secondary | ICD-10-CM

## 2021-11-18 NOTE — Progress Notes
Date of Service: 11/18/2021    Mindy Johnston is a 76 y.o. female.       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 white female with a history of primary hypertension, hyperlipidemia, family history of premature CAD, OSA using a BiPAP machine, osteoarthritis and status post previous surgical intervention, history of dog bite with several injuries treated with antibiotics and surgical intervention.    Patient was last seen in the office on 09/25/2021, since then she was a valuated with the following:    ? 2D echo Doppler study dated 10/01/2021-normal LVEF, normal diastolic function, the right ventricle is normal size and function, mild MR, trace TR and normal PAP = 32 mmHg  ? Regadenoson perfusion imaging study dated 10/02/2021-normal perfusion pattern without any ischemia, normal a LVEF = 81%.  ? Carotid artery duplex dated 10/01/2021-no hemodynamically significant stenosis of the right carotid and left carotid system    At a previous office visit [name] was initiated on lisinopril for hypertension, today her blood pressure is under good control.    Patient is doing well from a cardiac standpoint without any symptoms of chest pain, no heart palpitations.         Vitals:    11/18/21 1526   BP: 118/72   BP Source: Arm, Left Upper   Pulse: 67   SpO2: 96%   O2 Device: None (Room air)   PainSc: Zero   Weight: 65.8 kg (145 lb)   Height: 152.4 cm (5')     Body mass index is 28.32 kg/m?Marland Kitchen     Past Medical History  Patient Active Problem List    Diagnosis Date Noted   ? Dog bite of left calf 09/25/2021   ? Hypertension 09/24/2021     -10/24/15- [facility]- Echo- Normal LV systolic function. LV EF 70%. Mild LV hypertrophy. Mild tricuspid regurgitation. Right ventricular systolic pressure is consistent with moderate pulmonary hypertension. Estimated peak RSVP is . No significant pericardial effusion.   -10/24/15- [facility]- Treadmill Stress Echo- No exercise induced chest pain. No EKG evidence of ischemia.  Normal hemodynamic response to exercise. No significant exercise induced arrhythmias.      ? Hyperlipidemia 09/24/2021   ? OSA (obstructive sleep apnea) 09/24/2021   ? S/P spinal surgery 06/15/2017   ? Osteoporosis 06/15/2017   ? 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 Cervical stenosis of spinal canal 06/29/2016         Review of Systems   Constitutional: Negative.   HENT: Negative.    Eyes: Negative.    Cardiovascular: Negative.    Respiratory: Negative.    Endocrine: Negative.    Hematologic/Lymphatic: Negative.    Skin: Negative.    Musculoskeletal: Negative.    Gastrointestinal: Negative.    Genitourinary: Negative.    Neurological: Negative.    Psychiatric/Behavioral: Negative.    Allergic/Immunologic: Negative.        Physical Exam  General Appearance: normal in appearance  Skin: warm, moist, no ulcers or xanthomas  Eyes: conjunctivae and lids normal, pupils are equal and round  Lips & Oral Mucosa: no pallor or cyanosis  Neck Veins: neck veins are flat, neck veins are not distended  Chest Inspection: chest is normal in appearance  Respiratory Effort: breathing comfortably, no respiratory distress  Auscultation/Percussion: lungs clear to auscultation, no rales or rhonchi, no wheezing  Cardiac Rhythm: regular rhythm and normal rate  Cardiac Auscultation: S1, S2 normal, no rub, no gallop  Murmurs: no murmur  Carotid Arteries: normal carotid upstroke bilaterally, bilateral carotid bruits  Lower Extremity Edema: no lower extremity edema  Abdominal Exam: soft, non-tender, no masses, bowel sounds normal  Liver & Spleen: no organomegaly  Language and Memory: patient responsive and seems to comprehend information  Neurologic Exam: neurological assessment grossly intact      Cardiovascular Studies      Cardiovascular Health Factors  Vitals BP Readings from Last 3 Encounters:   11/18/21 118/72   10/01/21 (!) 141/80   09/25/21 (!) 148/78     Wt Readings from Last 3 Encounters:   11/18/21 65.8 kg (145 lb)   10/01/21 64.4 kg (142 lb)   09/25/21 68.3 kg (150 lb 9.6 oz)     BMI Readings from Last 3 Encounters:   11/18/21 28.32 kg/m?   10/01/21 27.73 kg/m?   09/25/21 29.41 kg/m?      Smoking Social History     Tobacco Use   Smoking Status Never   Smokeless Tobacco Never      Lipid Profile Cholesterol   Date Value Ref Range Status   11/17/2021 190  Final     HDL   Date Value Ref Range Status   11/17/2021 41  Final     LDL   Date Value Ref Range Status   11/17/2021 122 (H) <100 Final     Triglycerides   Date Value Ref Range Status   11/17/2021 135  Final      Blood Sugar No results found for: HGBA1C  Glucose   Date Value Ref Range Status   07/11/2021 61 (L) 70 - 105 Final          Problems Addressed Today  Encounter Diagnoses   Name Primary?   ? Pure hypercholesterolemia Yes   ? Primary hypertension    ? OSA (obstructive sleep apnea)    ? Spinal stenosis of lumbar region with neurogenic claudication    ? S/P spinal surgery    ? Dog bite of left calf, initial encounter        Assessment and Plan       Assessment:    1.  Bilateral carotid bruit-no obstructive disease was identified by the recently performed carotid artery duplex  2.  Primary hypertension-under good control  3.  Hyperlipidemia-patient would like to manage for now with diet, she does not want to initiate statin therapy  4.  No documented history of CAD  5.  Family history of premature CAD  Father underwent CABG x2  1 brother underwent CABG and had an MI  1 brother had MI and underwent stent placement  6.  OSA using a BiPAP machine  7.  Osteoarthritis  8.  History of dog bite w/  several injuries-recovered    Plan:    1.  Continue all current medications  2.  I recommended overall risk factors modifications, weight reduction, low-fat, low-cholesterol, low sugar, low carbohydrate and low fat diet, regular physical activity.   3.  Repeat fasting lipid and liver profile with your office in 3 months, initiate atorvastatin 20 mg p.o. daily if LDL not improved  4.  Follow-up office visit in 1 year    Total Time Today was 30 minutes in the following activities: Preparing to see the patient, Obtaining and/or reviewing separately obtained history, Performing a medically appropriate examination and/or evaluation, Counseling and educating the patient/family/caregiver, Ordering  medications, tests, or procedures, Referring and communication with other health care professionals (when not separately reported), Documenting clinical information in the electronic or other health record, Independently interpreting results (not separately reported) and communicating results to the patient/family/caregiver and Care coordination (not separately reported)         Current Medications (including today's revisions)  ? acetaminophen SR (TYLENOL) 650 mg tablet Take one tablet by mouth every 6 hours as needed for Pain.   ? albuterol sulfate (PROAIR HFA) 90 mcg/actuation HFA aerosol inhaler Inhale two puffs by mouth into the lungs every 4 hours as needed for Wheezing or Shortness of Breath. Shake well before use.   ? ascorbic acid (vitamin C) 500 mg tablet Take one tablet by mouth daily.   ? calcium carbonate-vitamin D3 600 mg-20 mcg (800 unit) tab Take 1 tablet by mouth daily.   ? coenzyme Q10 200 mg capsule Take one capsule by mouth daily.   ? gabapentin (NEURONTIN) 300 mg capsule Take two capsules by mouth daily. 2 tab pm 1 tab am   ? lisinopril (ZESTRIL) 2.5 mg tablet Take one tablet by mouth twice daily.   ? magnesium oxide (MAGOX) 400 mg (241.3 mg magnesium) tablet Take one tablet by mouth daily.   ? naproxen sodium (ALEVE) 220 mg tablet Take one tablet by mouth daily. Take with food.   ? omega-3 fatty acids/fish oil (FISH OIL-OMEGA-3 FATTY ACIDS) 300-1,000 mg cap Take 1 capsule by mouth daily.   ? TURMERIC-TURMERIC ROOT EXTRACT PO Take 500 mg by mouth daily.   ? vitamin E 400 unit capsule Take one capsule by mouth daily.   ? vitamins, B complex tablet(+) (B COMPLEX) capsule Take one capsule by mouth daily.   ? Zinc Gluconate 100 mg tab Take one tablet by mouth daily.

## 2021-11-20 ENCOUNTER — Encounter: Admit: 2021-11-20 | Discharge: 2021-11-20 | Payer: MEDICARE | Primary: Family

## 2022-02-17 IMAGING — CR [ID]
1 series · 1 of 1 positions shown · non-contrast
Comparison: none

[abdomen kub supine]
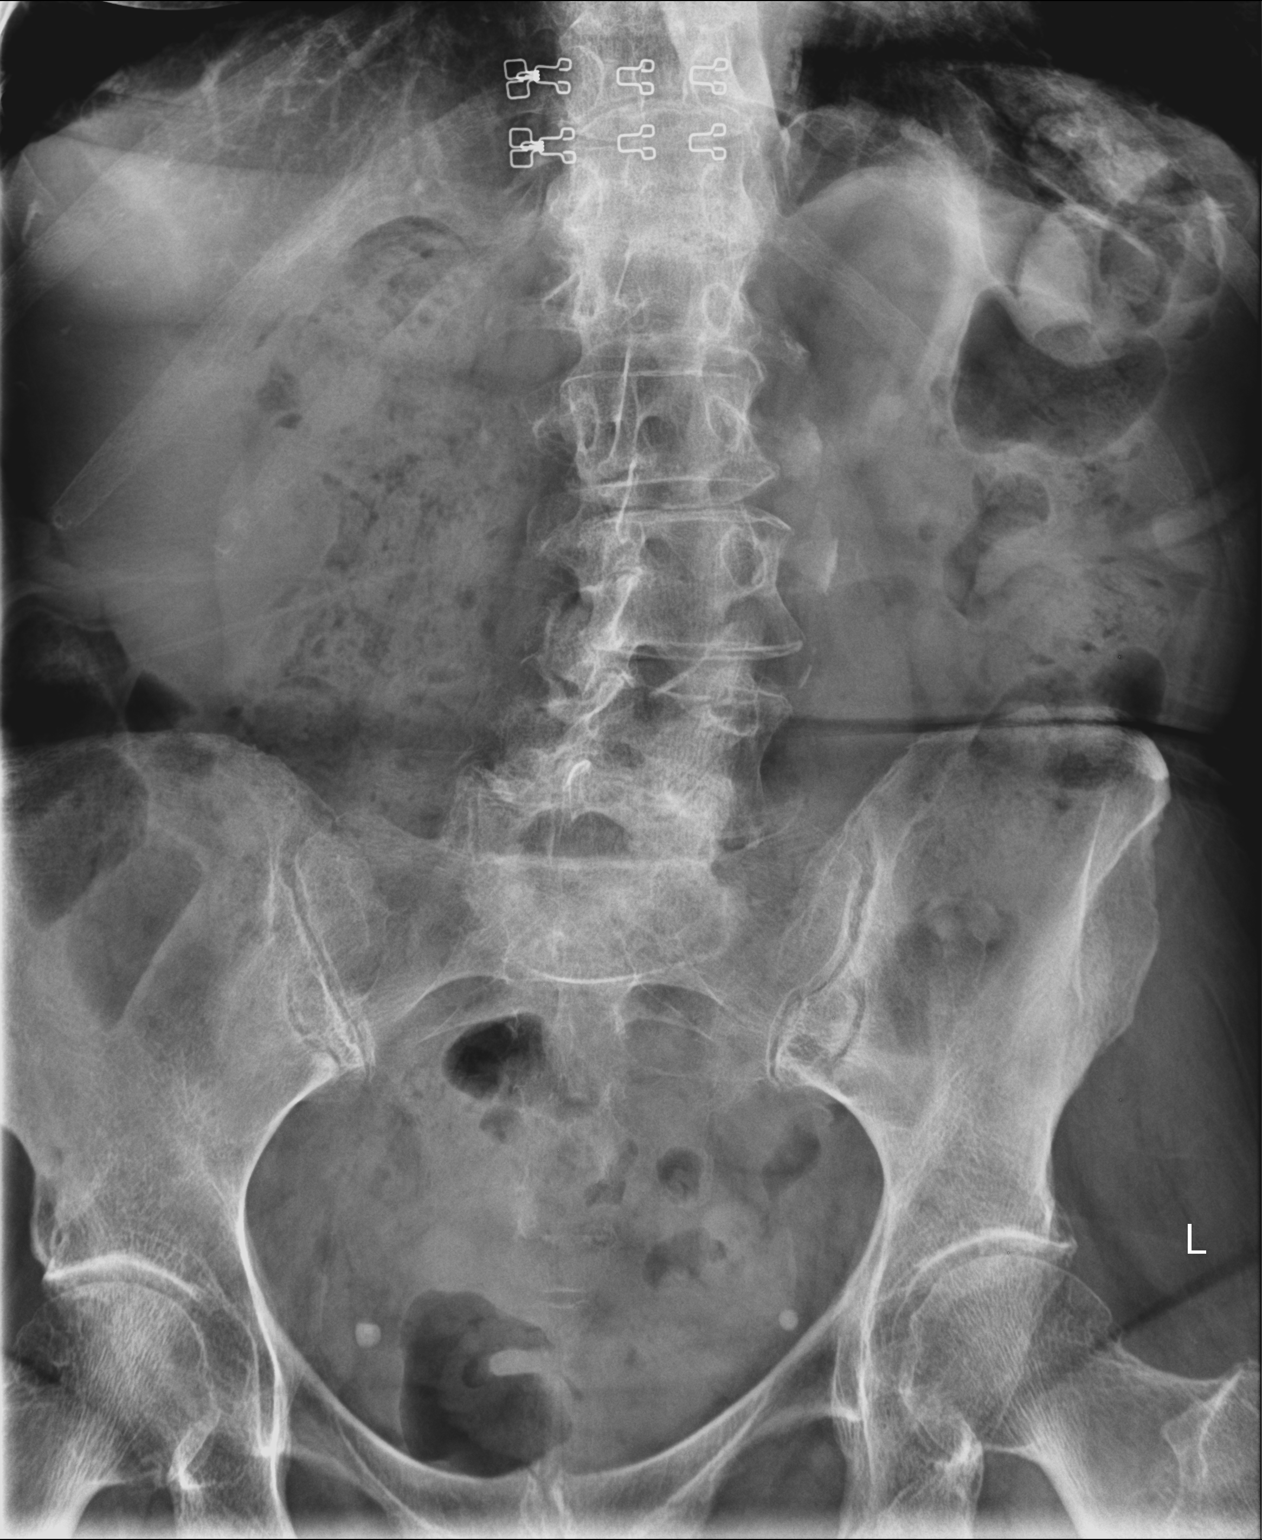

[1 of 1 positions shown; findings below may reference images not displayed]

DIAGNOSTIC STUDIES

EXAM

XR kub

INDICATION

diarrhea post op
diarrhea post op X 3 MONTHS FOR TIB/FIB SURGERY, DENIES TRAUMA TO ABD AREA

TECHNIQUE

Standard KUB was obtained.

COMPARISONS

None available

FINDINGS

Air and fecal material is noted throughout the colon without distention. There is levo rotatory
scoliosis. Calcifications in the pelvis are felt to be vascular.

IMPRESSION

Moderate retained fecal material. No obstruction is seen.

Tech Notes:

diarrhea post op X 3 MONTHS FOR TIB/FIB SURGERY, DENIES TRAUMA TO ABD AREA

## 2022-03-03 IMAGING — MG MM mammogram 2D screen bilat
1 series · 4 of 4 positions shown · non-contrast
Comparison: none

[Series 2: R CC · right · 4 of 4 slices shown]
[im 1/4]
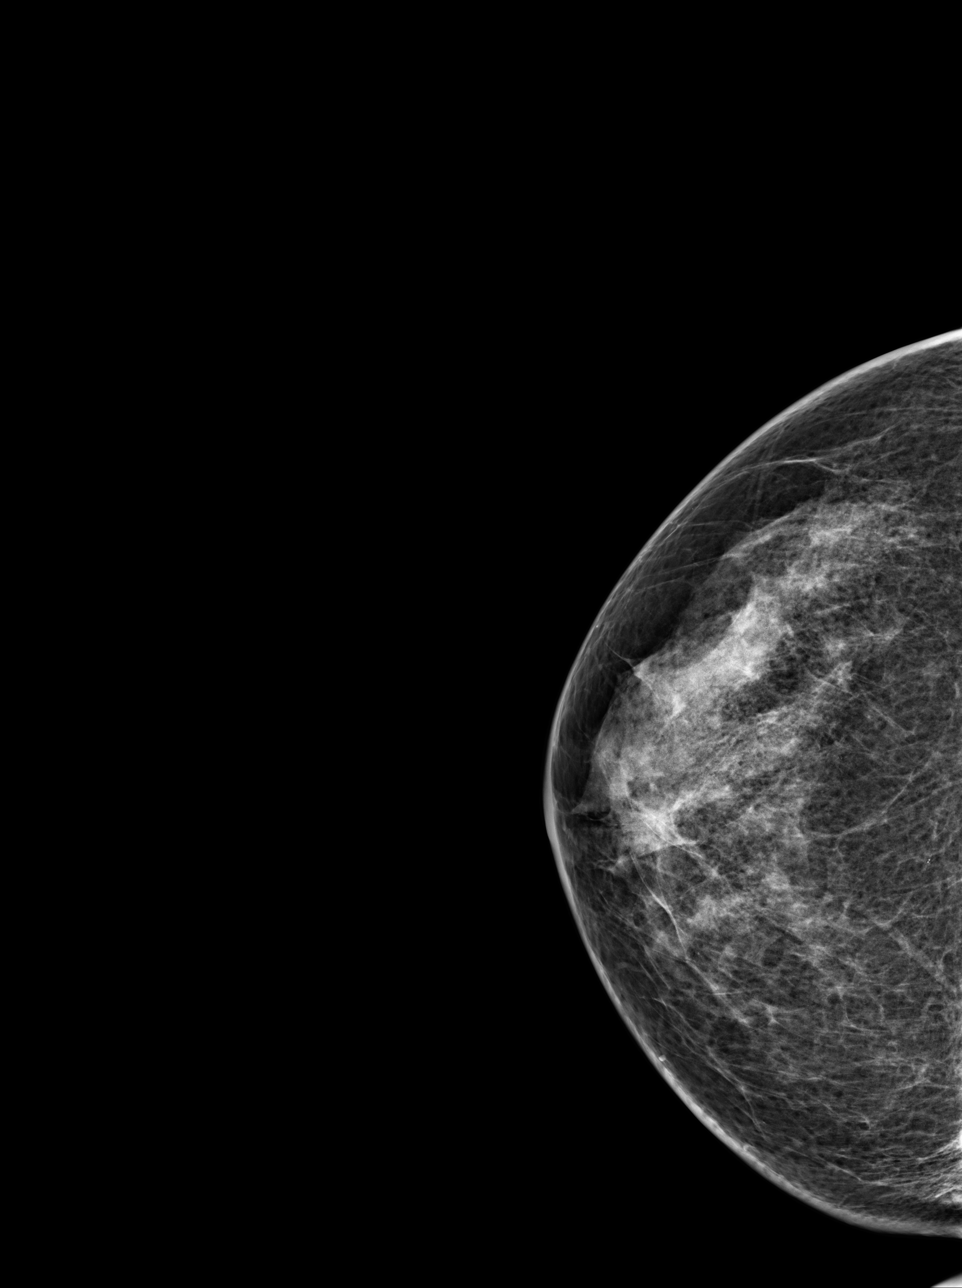
[im 2/4]
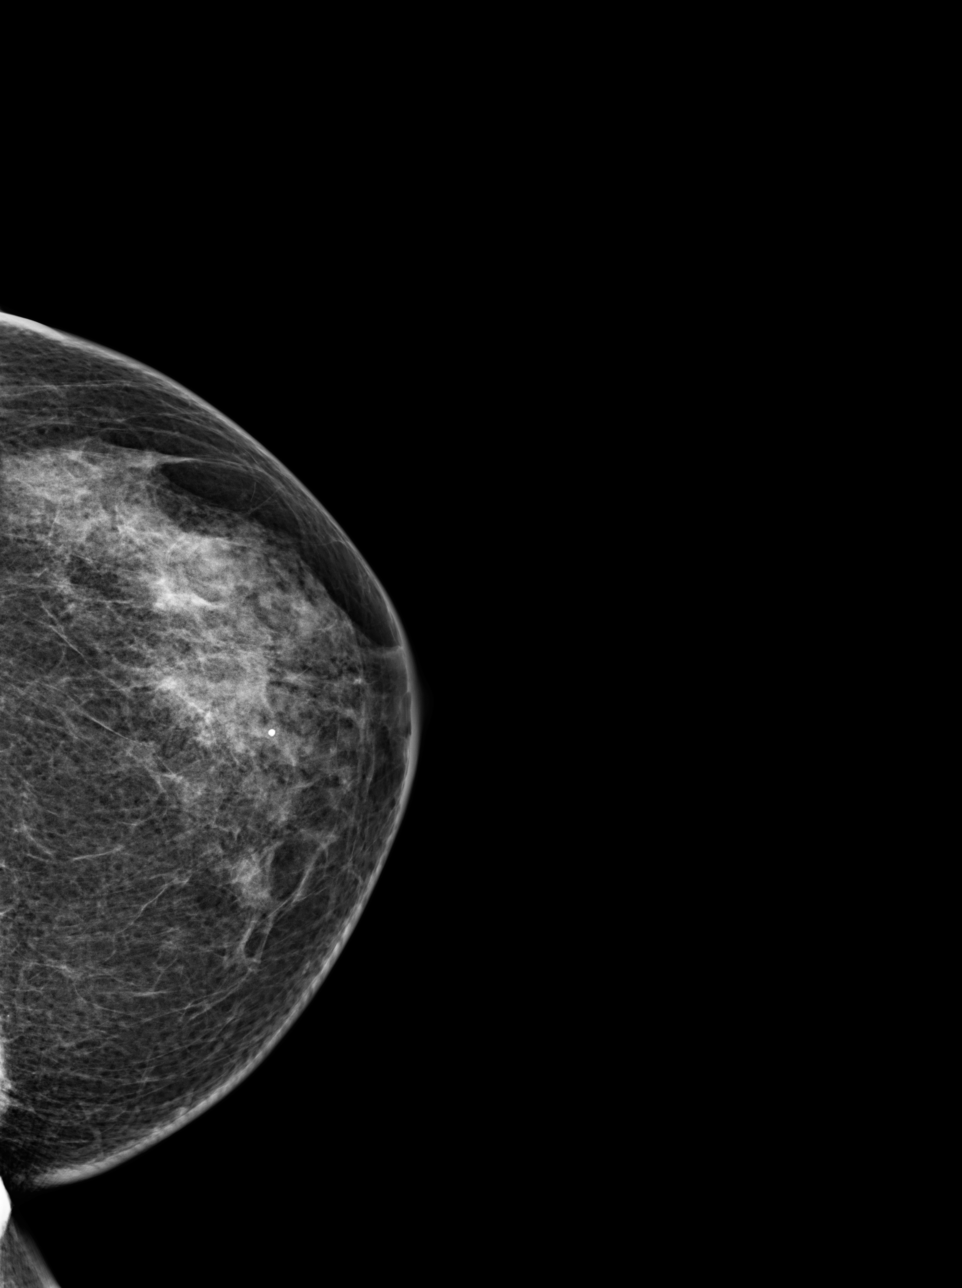
[im 3/4]
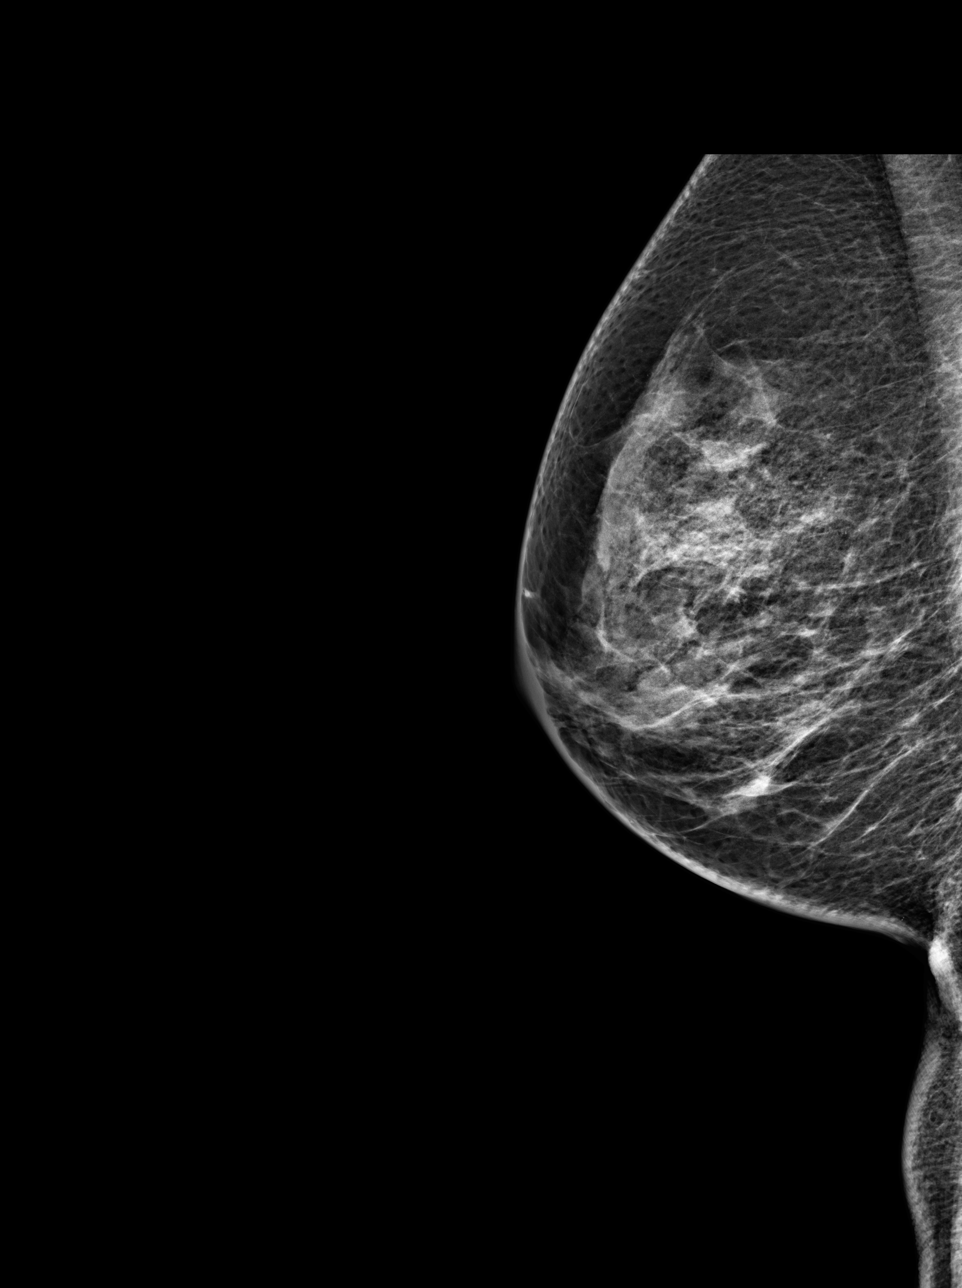
[im 4/4]
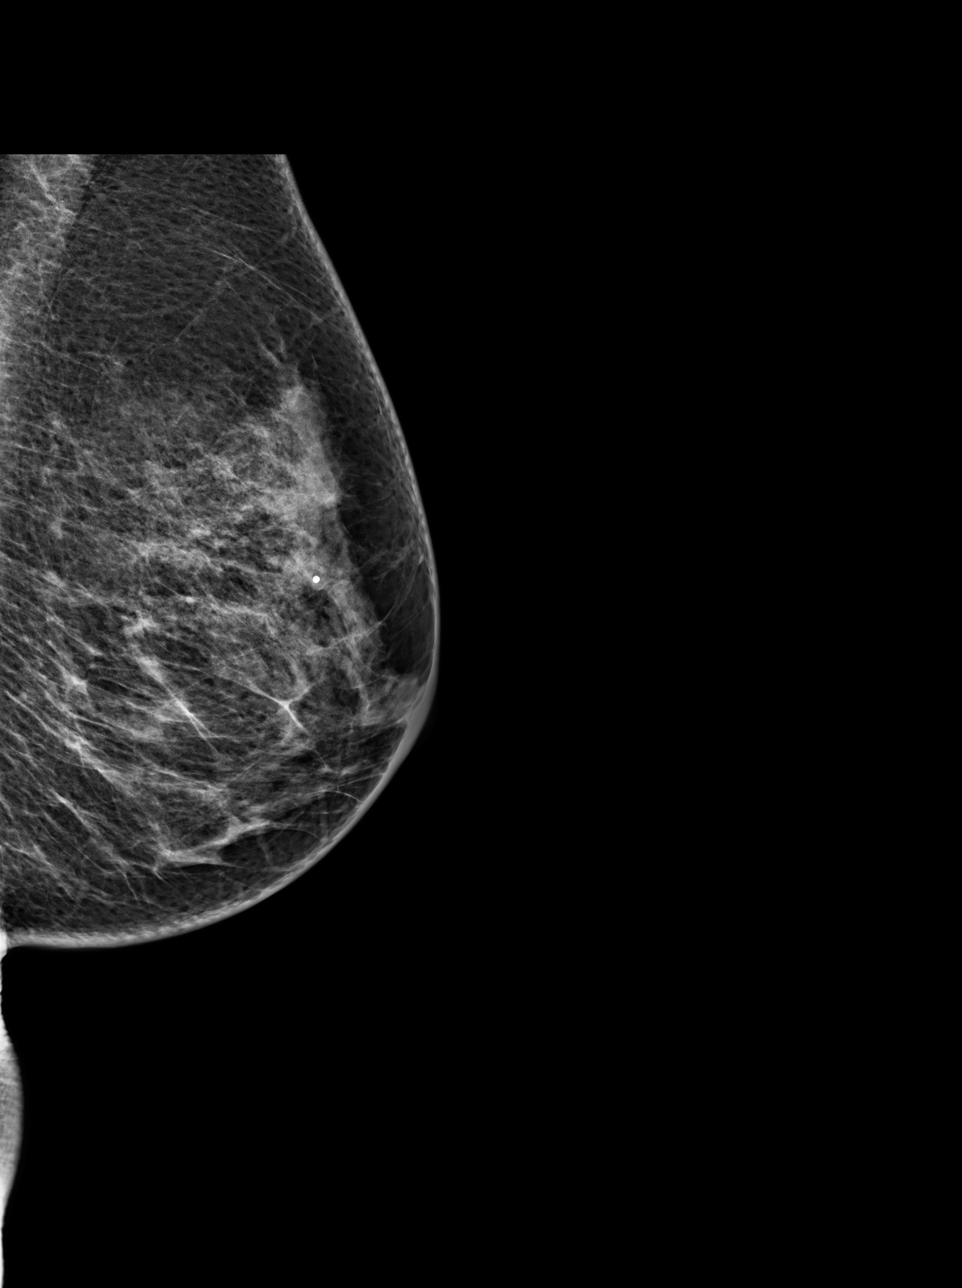

[4 of 4 positions shown; findings below may reference images not displayed]

EXAM

BILATERAL DIGITAL SCREENING MAMMOGRAM, CPT I9R9R; WITH CAD

INDICATION

NULLIPARITY.  TC SCORE:  LIFETIME RISK=  3.6%.  SCREENING.  AB (2D- LIMITED NECK ROM PREVENTS 3D)
PRIORS: 0100.

TECHNIQUE

Bilateral digital CC and MLO views were generated and reviewed.

COMPARISONS

Previous examinations dated as far back as 07/27/2018.

FINDINGS

The breasts are heterogeneously dense. This limits the sensitivity of mammography. There are no
suspicious clusters of micro calcifications. There are no suspicious spiculated masses. There is no
evidence of skin thickening and retraction. There is no evidence of axillary lymphadenopathy. Benign
calcifications are identified.

IMPRESSION

No localizing signs of malignancy.

BI-RADS 2, BENIGN.

A reminder letter will be sent.

A twelve month screening mammogram is recommended and a follow-up letter will be scheduled.

Computer Aided Detection was utilized for this interpretation.

A. A negative x-ray report should not delay biopsy if a dominant or clinically suspicious mass is
present.

B. A small percentage (probably less than 10%) of cancers will not be identified by x-ray, and there
will be a similar small percentage of false positive reports.

C. Adenosis and dense breasts may obscure an underlying neoplasm.

Tech Notes:

## 2022-10-08 ENCOUNTER — Encounter: Admit: 2022-10-08 | Discharge: 2022-10-08 | Payer: MEDICARE | Primary: Family

## 2022-10-08 DIAGNOSIS — I10 Essential (primary) hypertension: Secondary | ICD-10-CM

## 2022-10-08 DIAGNOSIS — E7800 Pure hypercholesterolemia, unspecified: Secondary | ICD-10-CM

## 2022-11-18 ENCOUNTER — Encounter: Admit: 2022-11-18 | Discharge: 2022-11-18 | Payer: MEDICARE | Primary: Family

## 2022-11-19 ENCOUNTER — Encounter: Admit: 2022-11-19 | Discharge: 2022-11-19 | Payer: MEDICARE

## 2022-11-19 ENCOUNTER — Ambulatory Visit: Admit: 2022-11-19 | Discharge: 2022-11-20 | Payer: MEDICARE

## 2022-11-19 DIAGNOSIS — G4733 Obstructive sleep apnea (adult) (pediatric): Secondary | ICD-10-CM

## 2022-11-19 DIAGNOSIS — Z136 Encounter for screening for cardiovascular disorders: Secondary | ICD-10-CM

## 2022-11-19 DIAGNOSIS — Z9889 Other specified postprocedural states: Secondary | ICD-10-CM

## 2022-11-19 DIAGNOSIS — S81852A Open bite, left lower leg, initial encounter: Secondary | ICD-10-CM

## 2022-11-19 DIAGNOSIS — M48062 Spinal stenosis, lumbar region with neurogenic claudication: Secondary | ICD-10-CM

## 2022-11-19 DIAGNOSIS — I10 Essential (primary) hypertension: Secondary | ICD-10-CM

## 2022-11-19 DIAGNOSIS — E7800 Pure hypercholesterolemia, unspecified: Secondary | ICD-10-CM

## 2022-11-19 DIAGNOSIS — M810 Age-related osteoporosis without current pathological fracture: Secondary | ICD-10-CM

## 2022-11-19 NOTE — Progress Notes
Date of Service: 11/19/2022    Mindy Johnston is a 77 y.o. female.       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  white female with a history of primary hypertension, hyperlipidemia, family history of premature CAD, OSA using a BiPAP machine, osteoarthritis and status post previous surgical intervention, history of dog bite with several injuries treated with antibiotics and surgical intervention.     Present time [name] does not have any symptoms of chest pain, no heart palpitations.  She does have an upcoming appointment with Dr. Farrel Johnston this summer to discuss OSA and BiPAP machine settings.    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    Previously patient was evaluated with the following:      2D echo Doppler study dated 10/01/2021-normal LVEF, normal diastolic function, the right ventricle is normal size and function, mild MR, trace TR and normal PAP = 32 mmHg  Regadenoson perfusion imaging study dated 10/02/2021-normal perfusion pattern without any ischemia, normal a LVEF = 81%.  Carotid artery duplex dated 10/01/2021-no hemodynamically significant stenosis of the right carotid and left carotid system         Vitals:    11/19/22 1123   BP: (!) 142/82   BP Source: Arm, Left Upper   Pulse: 67   SpO2: 97%   O2 Device: None (Room air)   PainSc: Five   Weight: 65.1 kg (143 lb 9.6 oz)   Height: 152.4 cm (5')     Body mass index is 28.04 kg/m?Marland Kitchen     Past Medical History  Patient Active Problem List    Diagnosis Date Noted    Dog bite of left calf 09/25/2021    Hypertension 09/24/2021     -10/24/15- [facility]- Echo- Normal LV systolic function. LV EF 70%. Mild LV hypertrophy. Mild tricuspid regurgitation. Right ventricular systolic pressure is consistent with moderate pulmonary hypertension. Estimated peak RSVP is . No significant pericardial effusion.   -10/24/15- [facility]- Treadmill Stress Echo- No exercise induced chest pain. No EKG evidence of ischemia.  Normal hemodynamic response to exercise. No significant exercise induced arrhythmias.       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   HENT: Negative.     Eyes: Negative.    Cardiovascular:  Positive for dyspnea on exertion and leg swelling.   Respiratory: Negative.     Endocrine: Negative.    Hematologic/Lymphatic: Negative.    Skin: Negative.    Musculoskeletal:  Positive for arthritis, back pain, joint pain, muscle cramps, myalgias and neck pain.   Gastrointestinal: Negative.    Genitourinary:  Positive for bladder incontinence.   Neurological: Negative.    Psychiatric/Behavioral: Negative.     Allergic/Immunologic: Negative.        Physical Exam  General Appearance: normal in appearance  Skin: warm, moist, no ulcers or xanthomas  Eyes: conjunctivae and lids normal, pupils are equal and round  Lips & Oral Mucosa: no pallor or cyanosis  Neck Veins: neck veins are  flat, neck veins are not distended  Chest Inspection: chest is normal in appearance  Respiratory Effort: breathing comfortably, no respiratory distress  Auscultation/Percussion: lungs clear to auscultation, no rales or rhonchi, no wheezing  Cardiac Rhythm: regular rhythm and normal rate  Cardiac Auscultation: S1, S2 normal, no rub, no gallop  Murmurs: no murmur  Carotid Arteries: normal carotid upstroke bilaterally, soft bilateral carotid bruits  Lower Extremity Edema: no lower extremity edema  Abdominal Exam: soft, non-tender, no masses, bowel sounds normal  Liver & Spleen: no organomegaly  Language and Memory: patient responsive and seems to comprehend information  Neurologic Exam: neurological assessment grossly intact    Cardiovascular Studies  Twelve-lead EKG demonstrates normal sinus rhythm, ventricular rate 62 bpm, no axis deviation.    Cardiovascular Health Factors  Vitals BP Readings from Last 3 Encounters:   11/19/22 (!) 142/82   11/18/21 118/72   10/01/21 (!) 141/80     Wt Readings from Last 3 Encounters:   11/19/22 65.1 kg (143 lb 9.6 oz)   11/18/21 65.8 kg (145 lb)   10/01/21 64.4 kg (142 lb)     BMI Readings from Last 3 Encounters:   11/19/22 28.04 kg/m?   11/18/21 28.32 kg/m?   10/01/21 27.73 kg/m?      Smoking Social History     Tobacco Use   Smoking Status Never   Smokeless Tobacco Never      Lipid Profile Cholesterol   Date Value Ref Range Status   11/17/2021 190  Final     HDL   Date Value Ref Range Status   11/17/2021 41  Final     LDL   Date Value Ref Range Status   11/17/2021 122 (H) <100 Final     Triglycerides   Date Value Ref Range Status   11/17/2021 135  Final      Blood Sugar No results found for: HGBA1C  Glucose   Date Value Ref Range Status   07/11/2021 61 (L) 70 - 105 Final          Problems Addressed Today  Encounter Diagnoses   Name Primary?    Primary hypertension Yes    Pure hypercholesterolemia     Spinal stenosis of lumbar region with neurogenic claudication     S/P spinal surgery     Osteoporosis, unspecified osteoporosis type, unspecified pathological fracture presence     OSA (obstructive sleep apnea)     Screening for heart disease        Assessment and Plan:    Assessment     1.  Bilateral carotid bruit-no obstructive disease was identified by previously performed carotid artery duplex  2.  Primary hypertension-under good control  3.  Hyperlipidemia-patient would like to manage for now with diet, she does not want to initiate statin therapy  4.  No documented history of CAD  5.  Family history of premature CAD  Father underwent CABG x2  1 brother underwent CABG and had an MI  1 brother had MI and underwent stent placement  6.  OSA using a BiPAP machine  7.  Osteoarthritis  8.  History of dog bite w/  several injuries - recovered    Plan:     1.  Continue current medications  2.  Strict low-sodium diet  3.  I encouraged physical activity  4.  Follow-up office visit as needed    Total Time Today was 30 minutes in the following activities: Preparing to see the patient, Obtaining and/or reviewing  separately obtained history, Performing a medically appropriate examination and/or evaluation, Counseling and educating the patient/family/caregiver, Ordering medications, tests, or procedures, Referring and communication with other health care professionals (when not separately reported), Documenting clinical information in the electronic or other health record, Independently interpreting results (not separately reported) and communicating results to the patient/family/caregiver, and Care coordination (not separately reported)            Current Medications (including today's revisions)   acetaminophen SR (TYLENOL) 650 mg tablet Take one tablet by mouth every 6 hours as needed for Pain.    albuterol sulfate (PROAIR HFA) 90 mcg/actuation HFA aerosol inhaler Inhale two puffs by mouth into the lungs every 4 hours as needed for Wheezing or Shortness of Breath. Shake well before use.    ascorbic acid (vitamin C) 500 mg tablet Take one tablet by mouth daily.    calcium carbonate-vitamin D3 600 mg-20 mcg (800 unit) tab Take 1 tablet by mouth daily.    coenzyme Q10 200 mg capsule Take one capsule by mouth daily.    gabapentin (NEURONTIN) 300 mg capsule Take two capsules by mouth daily. 2 tab pm 1 tab am    lisinopril (ZESTRIL) 2.5 mg tablet Take one tablet by mouth twice daily.    magnesium oxide (MAGOX) 400 mg (241.3 mg magnesium) tablet Take one tablet by mouth daily.    naproxen sodium (ALEVE) 220 mg tablet Take one tablet by mouth daily. Take with food.    omega-3 fatty acids/fish oil (FISH OIL-OMEGA-3 FATTY ACIDS) 300-1,000 mg cap Take one capsule by mouth daily.    TURMERIC-TURMERIC ROOT EXTRACT PO Take 500 mg by mouth daily.    vitamin E 400 unit capsule Take one capsule by mouth daily.    vitamins, B complex tablet(+) (B COMPLEX) capsule Take one capsule by mouth daily.    WHEAT DEXTRIN PO Take  by mouth as Needed. Benefiber    Zinc Gluconate 100 mg tab Take one tablet by mouth daily.
